# Patient Record
Sex: Female | Born: 2005 | Race: White | Hispanic: No | Marital: Single | Smoking: Never smoker
Health system: Southern US, Community
[De-identification: ages and names within clinical notes are randomized; demographics above are authoritative.]

## PROBLEM LIST (undated history)

## (undated) DIAGNOSIS — F419 Anxiety disorder, unspecified: Secondary | ICD-10-CM

## (undated) DIAGNOSIS — F32A Depression, unspecified: Secondary | ICD-10-CM

## (undated) DIAGNOSIS — G43909 Migraine, unspecified, not intractable, without status migrainosus: Secondary | ICD-10-CM

## (undated) HISTORY — PX: WISDOM TOOTH EXTRACTION: SHX21

## (undated) HISTORY — PX: TONSILLECTOMY: SUR1361

## (undated) HISTORY — DX: Anxiety disorder, unspecified: F41.9

## (undated) HISTORY — DX: Depression, unspecified: F32.A

---
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
## 2023-01-01 ENCOUNTER — Other Ambulatory Visit: Payer: No Typology Code available for payment source

## 2023-01-01 ENCOUNTER — Ambulatory Visit (INDEPENDENT_AMBULATORY_CARE_PROVIDER_SITE_OTHER): Payer: No Typology Code available for payment source | Admitting: Advanced Practice Midwife

## 2023-01-01 VITALS — BP 113/72 | HR 87 | Wt 185.0 lb

## 2023-01-01 DIAGNOSIS — Z3403 Encounter for supervision of normal first pregnancy, third trimester: Secondary | ICD-10-CM

## 2023-01-01 DIAGNOSIS — T300 Burn of unspecified body region, unspecified degree: Secondary | ICD-10-CM

## 2023-01-01 DIAGNOSIS — Z3402 Encounter for supervision of normal first pregnancy, second trimester: Secondary | ICD-10-CM

## 2023-01-01 DIAGNOSIS — Z3A28 28 weeks gestation of pregnancy: Secondary | ICD-10-CM

## 2023-01-01 MED ORDER — SERTRALINE HCL 50 MG PO TABS: 50.0000 mg | ORAL_TABLET | Freq: Every day | ORAL | 1 refills | Status: DC

## 2023-01-01 NOTE — Progress Notes (Addendum)
   PRENATAL VISIT NOTE  Subjective:  Tammy Stone is a 17 y.o. G1P0 at [redacted]w[redacted]d being seen today for ongoing prenatal care.  She is currently monitored for the following issues for this low-risk pregnancy and has Supervision of normal first teen pregnancy on their problem list.  Patient reports no complaints.  Contractions: Not present. Vag. Bleeding: None.  Movement: Present. Denies leaking of fluid.   The following portions of the patient's history were reviewed and updated as appropriate: allergies, current medications, past family history, past medical history, past social history, past surgical history and problem list. Problem list updated.  Objective:   Vitals:   01/01/23 0827  BP: 113/72  Pulse: 87  Weight: 185 lb (83.9 kg)    Fetal Status: Fetal Heart Rate (bpm): 145 Fundal Height: 28 cm Movement: Present     General:  Alert, oriented and cooperative. Patient is in no acute distress.  Skin: Skin is warm and dry. No rash noted.   Cardiovascular: Normal heart rate noted  Respiratory: Normal respiratory effort, no problems with respiration noted  Abdomen: Soft, gravid, appropriate for gestational age.  Pain/Pressure: Absent     Pelvic: Cervical exam deferred        Extremities: Normal range of motion.  Edema: None  Mental Status: Normal mood and affect. Normal behavior. Normal judgment and thought content.   Assessment and Plan:  Pregnancy: G1P0 at [redacted]w[redacted]d  1. Supervision of normal first teen pregnancy in third trimester - Welcome to third trimester! - TDAP declined - Fasting GTT today, preemptive discussion, impact of GDM on prenatal care and fetal surveillance  2. [redacted] weeks gestation of pregnancy  3. Burn - Patient will 3cm x 1 cm superficial burn on her neck, slightly right of midline. Healing well. Advised OTC vitamin e oil, can combined with eczema cream as needed. Use sunblock during day  Preterm labor symptoms and general obstetric precautions including but  not limited to vaginal bleeding, contractions, leaking of fluid and fetal movement were reviewed in detail with the patient. Please refer to After Visit Summary for other counseling recommendations.    Future Appointments  Date Time Provider Department Center  01/01/2023  8:55 AM Calvert Cantor, CNM [facility]  01/15/2023  3:30 PM Calvert Cantor, CNM [facility]  01/29/2023  3:30 PM Calvert Cantor, CNM [facility]  02/03/2023  1:20 PM Margarita Mail, DO [facility]    Calvert Cantor, [place]

## 2023-01-01 NOTE — Progress Notes (Signed)
CC: ROB, Denies any concerns

## 2023-01-02 ENCOUNTER — Other Ambulatory Visit: Payer: Self-pay | Admitting: *Deleted

## 2023-01-02 LAB — CBC
Hematocrit: 34.3 % (ref 34.0–46.6)
Hemoglobin: 10.9 g/dL — ABNORMAL LOW (ref 11.1–15.9)
MCH: 27.8 pg (ref 26.6–33.0)
MCHC: 31.8 g/dL (ref 31.5–35.7)
MCV: 88 fL (ref 79–97)
Platelets: 330 10*3/uL (ref 150–450)
RBC: 3.92 x10E6/uL (ref 3.77–5.28)
RDW: 13.1 % (ref 11.7–15.4)
WBC: 11.5 10*3/uL — ABNORMAL HIGH (ref 3.4–10.8)

## 2023-01-02 LAB — GLUCOSE TOLERANCE, 2 HOURS W/ 1HR
Glucose, 1 hour: 116 mg/dL (ref 70–179)
Glucose, 2 hour: 102 mg/dL (ref 70–152)
Glucose, Fasting: 75 mg/dL (ref 70–91)

## 2023-01-02 LAB — RPR: RPR Ser Ql: NONREACTIVE

## 2023-01-02 LAB — HIV ANTIBODY (ROUTINE TESTING W REFLEX): HIV Screen 4th Generation wRfx: NONREACTIVE

## 2023-01-02 MED ORDER — FERROUS GLUCONATE 324 (38 FE) MG PO TABS: 324.0000 mg | ORAL_TABLET | ORAL | 3 refills | Status: AC

## 2023-01-15 ENCOUNTER — Encounter: Payer: Self-pay | Admitting: Advanced Practice Midwife

## 2023-01-15 ENCOUNTER — Ambulatory Visit (INDEPENDENT_AMBULATORY_CARE_PROVIDER_SITE_OTHER): Payer: No Typology Code available for payment source | Admitting: Advanced Practice Midwife

## 2023-01-15 VITALS — BP 113/73 | HR 84 | Wt 186.0 lb

## 2023-01-15 DIAGNOSIS — Z3A30 30 weeks gestation of pregnancy: Secondary | ICD-10-CM

## 2023-01-15 DIAGNOSIS — Z3403 Encounter for supervision of normal first pregnancy, third trimester: Secondary | ICD-10-CM

## 2023-01-15 NOTE — Progress Notes (Signed)
ROB

## 2023-01-16 NOTE — Progress Notes (Signed)
   PRENATAL VISIT NOTE  Subjective:  Tammy Stone is a 17 y.o. G1P0 at [redacted]w[redacted]d being seen today for ongoing prenatal care.  She is currently monitored for the following issues for this low-risk pregnancy and has Supervision of normal first teen pregnancy on their problem list.  Patient reports no complaints.  Contractions: Not present. Vag. Bleeding: None.  Movement: Present. Denies leaking of fluid.   The following portions of the patient's history were reviewed and updated as appropriate: allergies, current medications, past family history, past medical history, past social history, past surgical history and problem list. Problem list updated.  Objective:   Vitals:   01/15/23 1532  BP: 113/73  Pulse: 84  Weight: 186 lb (84.4 kg)    Fetal Status: Fetal Heart Rate (bpm): 160 Fundal Height: 30 cm Movement: Present     General:  Alert, oriented and cooperative. Patient is in no acute distress.  Skin: Skin is warm and dry. No rash noted.   Cardiovascular: Normal heart rate noted  Respiratory: Normal respiratory effort, no problems with respiration noted  Abdomen: Soft, gravid, appropriate for gestational age.  Pain/Pressure: Absent     Pelvic: Cervical exam deferred        Extremities: Normal range of motion.  Edema: None  Mental Status: Normal mood and affect. Normal behavior. Normal judgment and thought content.   Assessment and Plan:  Pregnancy: G1P0 at [redacted]w[redacted]d  1. Supervision of normal first teen pregnancy in third trimester - Doing so well in third trimester! - Quick review of kick counts, interventions for low kick number  2. [redacted] weeks gestation of pregnancy   Preterm labor symptoms and general obstetric precautions including but not limited to vaginal bleeding, contractions, leaking of fluid and fetal movement were reviewed in detail with the patient. Please refer to After Visit Summary for other counseling recommendations.    Future Appointments  Date Time Provider  Department Center  01/29/2023  3:30 PM Calvert Cantor, [place] [facility]  02/03/2023  1:20 PM Margarita Mail, DO [facility]  02/12/2023  4:10 PM Calvert Cantor, CNM [facility]  02/26/2023  1:50 PM [place] Large, Jethro Bastos, MD [facility]  03/04/2023  3:30 PM [place] Bing, MD [facility]  03/12/2023  3:30 PM [place] Large, Jethro Bastos, MD [facility]  03/19/2023  3:30 PM [place] Bing, MD [facility]    Calvert Cantor, CNM

## 2023-01-23 ENCOUNTER — Ambulatory Visit: Payer: Self-pay | Admitting: Internal Medicine

## 2023-01-26 ENCOUNTER — Encounter (HOSPITAL_COMMUNITY): Payer: Self-pay | Admitting: Obstetrics & Gynecology

## 2023-01-26 ENCOUNTER — Inpatient Hospital Stay (HOSPITAL_COMMUNITY)
Admission: AD | Admit: 2023-01-26 | Discharge: 2023-01-26 | Disposition: A | Discharge status: Home / Self Care | Payer: No Typology Code available for payment source | Attending: Obstetrics & Gynecology | Admitting: Obstetrics & Gynecology

## 2023-01-26 DIAGNOSIS — O368130 Decreased fetal movements, third trimester, not applicable or unspecified: Secondary | ICD-10-CM | POA: Insufficient documentation

## 2023-01-26 DIAGNOSIS — Z3A31 31 weeks gestation of pregnancy: Secondary | ICD-10-CM | POA: Insufficient documentation

## 2023-01-26 DIAGNOSIS — O368190 Decreased fetal movements, unspecified trimester, not applicable or unspecified: Secondary | ICD-10-CM

## 2023-01-26 DIAGNOSIS — Z3689 Encounter for other specified antenatal screening: Secondary | ICD-10-CM

## 2023-01-26 NOTE — MAU Provider Note (Signed)
History     CSN: [id number]  Arrival date and time: 01/26/23 1332   Event Date/Time   First Provider Initiated Contact with Patient 01/26/23 1437      Chief Complaint  Patient presents with   Decreased Fetal Movement   HPI  Tammy Stone is a 17 y.o. female G1P0 @ [redacted]w[redacted]d here in MAU with complaints of decreased fetal movement. She reports not feeling movement since last night. She does have an anterior placenta. She has no other concerns. No bleeding no leaking of fluid.   OB History     Gravida  1   Para      Term      Preterm      AB      Living         SAB      IAB      Ectopic      Multiple      Live Births              Past Medical History:  Diagnosis Date   Migraine     Past Surgical History:  Procedure Laterality Date   TONSILLECTOMY     WISDOM TOOTH EXTRACTION      Family History  Problem Relation Age of Onset   Hypertension Mother    Hyperlipidemia Mother    Diabetes Mother    Thyroid cancer Father    Diabetes Maternal Grandmother    Hyperlipidemia Maternal Grandfather     Social History   Tobacco Use   Smoking status: Never   Smokeless tobacco: Never  Vaping Use   Vaping Use: Never used  Substance Use Topics   Alcohol use: No   Drug use: No    Allergies: No Known Allergies  Medications Prior to Admission  Medication Sig Dispense Refill Last Dose   ferrous gluconate (FERGON) 324 MG tablet Take 1 tablet (324 mg total) by mouth every other day. 30 tablet 3 01/25/2023   Prenatal Vit-Fe Fumarate-FA (MULTIVITAMIN-PRENATAL) 27-0.8 MG TABS tablet Take 1 tablet by mouth daily at 12 noon.   01/26/2023   sertraline (ZOLOFT) 50 MG tablet Take 50 mg by mouth daily.   01/25/2023   fexofenadine (ALLEGRA) 60 MG tablet Take 60 mg by mouth daily.      sertraline (ZOLOFT) 50 MG tablet Take 1 tablet (50 mg total) by mouth daily. 30 tablet 1    No results found for this or any previous visit (from the past 48 hour(s)).   Review  of Systems  Gastrointestinal:  Negative for abdominal pain.  Genitourinary:  Negative for vaginal bleeding.   Physical Exam   Blood pressure 126/68, pulse 86, temperature 98.1 F (36.7 C), temperature source Oral, resp. rate 14, height 5\' 5"  (1.651 m), last menstrual period 06/17/2022, SpO2 99 %.  Physical Exam Constitutional:      Appearance: Normal appearance.  HENT:     Head: Normocephalic.  Neurological:     Mental Status: She is alert and oriented to person, place, and time.  Psychiatric:        Behavior: Behavior normal.    140 baseline, 15x15 accels with prolonged accels at times. No decels. Marked variability at times.   MAU Course  Procedures  MDM  Patient feeling normal movement at this time.   Assessment and Plan   A:  1. [redacted] weeks gestation of pregnancy   2. Decreased fetal movement during pregnancy, antepartum, single or unspecified fetus   3. NST (non-stress test)  reactive     P:  Dc home Kick counts reviewed Return to MAU with further DFM  Auryn Paige, Harolyn Rutherford, NP 01/26/2023 5:06 PM

## 2023-01-26 NOTE — MAU Note (Signed)
.  Tammy Stone is a 17 y.o. at [redacted]w[redacted]d here in MAU reporting: has not felt the baby move since last night, states she has an anterior placenta. Denies VB or LOF. No pain.   Pain score: 0 Vitals:   01/26/23 1341  BP: 126/68  Pulse: 86  Resp: 14  Temp: 98.1 F (36.7 C)  SpO2: 97%     FHT:158 (audible fetal movement) Lab orders placed from triage:  n/a

## 2023-01-29 ENCOUNTER — Encounter: Payer: Self-pay | Admitting: Advanced Practice Midwife

## 2023-01-29 ENCOUNTER — Ambulatory Visit (INDEPENDENT_AMBULATORY_CARE_PROVIDER_SITE_OTHER): Payer: No Typology Code available for payment source | Admitting: Advanced Practice Midwife

## 2023-01-29 VITALS — BP 115/74 | HR 90 | Wt 193.0 lb

## 2023-01-29 DIAGNOSIS — Z3A32 32 weeks gestation of pregnancy: Secondary | ICD-10-CM

## 2023-01-29 DIAGNOSIS — Z3403 Encounter for supervision of normal first pregnancy, third trimester: Secondary | ICD-10-CM

## 2023-01-29 DIAGNOSIS — R21 Rash and other nonspecific skin eruption: Secondary | ICD-10-CM

## 2023-01-29 NOTE — Progress Notes (Signed)
ROB   Recent MAU visit on 01/26/23 for Decreased FM had NST done.  Pt has been moving fine since.   CC: Rash on right side of face.

## 2023-01-29 NOTE — Progress Notes (Signed)
ROB

## 2023-01-30 NOTE — Progress Notes (Signed)
   PRENATAL VISIT NOTE  Subjective:  Tammy Stone is a 17 y.o. G1P0 at [redacted]w[redacted]d being seen today for ongoing prenatal care.  She is currently monitored for the following issues for this low-risk pregnancy and has Supervision of normal first teen pregnancy on their problem list.  Patient reports  new flat rash on the right side of her face. Also has new raised rash disseminated along her torso. Rash is not itchy at either site. She has not taken medication for this complaint. Patient states [place] lot of time laying out.  .  Contractions: Not present. Vag. Bleeding: None.  Movement: Present. Denies leaking of fluid.   The following portions of the patient's history were reviewed and updated as appropriate: allergies, current medications, past family history, past medical history, past social history, past surgical history and problem list. Problem list updated.  Objective:   Vitals:   01/29/23 1538  BP: 115/74  Pulse: 90  Weight: 193 lb (87.5 kg)    Fetal Status: Fetal Heart Rate (bpm): 158 Fundal Height: 32 cm Movement: Present     General:  Alert, oriented and cooperative. Patient is in no acute distress.  Skin: Skin is warm and dry. No rash noted.   Cardiovascular: Normal heart rate noted  Respiratory: Normal respiratory effort, no problems with respiration noted  Abdomen: Soft, gravid, appropriate for gestational age.  Pain/Pressure: Absent     Pelvic: Cervical exam deferred        Extremities: Normal range of motion.  Edema: None  Mental Status: Normal mood and affect. Normal behavior. Normal judgment and thought content.   Assessment and Plan:  Pregnancy: G1P0 at [redacted]w[redacted]d  1. Supervision of normal first teen pregnancy in third trimester - Doing great! Birth class this week, can send questions via MyChart message as needed  2. Skin rash - Flat facial rash, no discharge, no signs of infection - Rash on torso seems consistent  with heat rash - Can trial Benadryl if rash becomes itchy as it heals - Discussed soothing bath packets (e.g. Aveeno oatmeal bath packets). Can use in warm bath or less diluted version as   3. [redacted] weeks gestation of pregnancy   Preterm labor symptoms and general obstetric precautions including but not limited to vaginal bleeding, contractions, leaking of fluid and fetal movement were reviewed in detail with the patient. Please refer to After Visit Summary for other counseling recommendations.    Future Appointments  Date Time Provider Department Center  02/03/2023  1:20 PM Margarita Mail, DO [facility]  02/12/2023  4:10 PM Calvert Cantor, CNM [facility]  02/26/2023  1:50 PM Anyanwu, Jethro Bastos, MD [facility]  03/04/2023  3:30 PM [place] Bing, MD [facility]  03/12/2023  3:30 PM Anyanwu, Jethro Bastos, MD [facility]  03/19/2023  3:30 PM [place] Bing, MD [facility]    Calvert Cantor, CNM

## 2023-02-03 ENCOUNTER — Ambulatory Visit: Payer: Medicaid Other | Admitting: Internal Medicine

## 2023-02-12 ENCOUNTER — Ambulatory Visit (INDEPENDENT_AMBULATORY_CARE_PROVIDER_SITE_OTHER): Payer: No Typology Code available for payment source | Admitting: Advanced Practice Midwife

## 2023-02-12 ENCOUNTER — Encounter: Payer: Self-pay | Admitting: Advanced Practice Midwife

## 2023-02-12 VITALS — BP 120/74 | HR 80 | Wt 191.0 lb

## 2023-02-12 DIAGNOSIS — Z3403 Encounter for supervision of normal first pregnancy, third trimester: Secondary | ICD-10-CM

## 2023-02-12 DIAGNOSIS — Z3A34 34 weeks gestation of pregnancy: Secondary | ICD-10-CM

## 2023-02-12 NOTE — Progress Notes (Signed)
ROB   Notes recent change in skin stretch marks on bottom area.

## 2023-02-14 NOTE — Progress Notes (Signed)
   PRENATAL VISIT NOTE  Subjective:  Tammy Stone is a 17 y.o. G1P0 at [redacted]w[redacted]d being seen today for ongoing prenatal care.  She is currently monitored for the following issues for this low-risk pregnancy and has Supervision of normal first teen pregnancy on their problem list.  Patient reports no complaints.  Contractions: Not present. Vag. Bleeding: None.  Movement: Present. Denies leaking of fluid.   Patient has questions about new onset stretch marks on her buttocks and low back.   The following portions of the patient's history were reviewed and updated as appropriate: allergies, current medications, past family history, past medical history, past social history, past surgical history and problem list. Problem list updated.  Objective:   Vitals:   02/12/23 1629  BP: 120/74  Pulse: 80  Weight: 191 lb (86.6 kg)    Fetal Status: Fetal Heart Rate (bpm): 132 Fundal Height: 34 cm Movement: Present     General:  Alert, oriented and cooperative. Patient is in no acute distress.  Skin: Skin is warm and dry. No rash noted.   Cardiovascular: Normal heart rate noted  Respiratory: Normal respiratory effort, no problems with respiration noted  Abdomen: Soft, gravid, appropriate for gestational age.  Pain/Pressure: Absent     Pelvic: Cervical exam deferred        Extremities: Normal range of motion.  Edema: None  Mental Status: Normal mood and affect. Normal behavior. Normal judgment and thought content.   Assessment and Plan:  Pregnancy: G1P0 at [redacted]w[redacted]d  1. Supervision of normal first teen pregnancy in third trimester - routine care - Super proud of [place] and how well she is doing in third trimester - Stretch marks examined, WNL, can continue applying lotion but will fade in postpartum period - Preemptive teaching: GBS and GC swabs at 36 weeks (next visit)  2. [redacted] weeks gestation of pregnancy   Preterm labor symptoms and general obstetric precautions including but not limited  to vaginal bleeding, contractions, leaking of fluid and fetal movement were reviewed in detail with the patient. Please refer to After Visit Summary for other counseling recommendations.    Future Appointments  Date Time Provider Department Center  02/26/2023  1:50 PM Anyanwu, Jethro Bastos, MD [facility]  03/04/2023  3:30 PM [place] Bing, MD [facility]  03/12/2023  3:30 PM Anyanwu, Jethro Bastos, MD [facility]  03/19/2023  3:30 PM [place] Bing, MD [facility]  04/07/2023 11:00 AM Margarita Mail, DO [facility]    Calvert Cantor, CNM

## 2023-02-26 ENCOUNTER — Encounter: Payer: Self-pay | Admitting: Obstetrics & Gynecology

## 2023-02-26 ENCOUNTER — Ambulatory Visit (INDEPENDENT_AMBULATORY_CARE_PROVIDER_SITE_OTHER): Payer: No Typology Code available for payment source | Admitting: Obstetrics & Gynecology

## 2023-02-26 ENCOUNTER — Other Ambulatory Visit (HOSPITAL_COMMUNITY)
Admission: RE | Admit: 2023-02-26 | Discharge: 2023-02-26 | Disposition: A | Discharge status: Home / Self Care | Payer: No Typology Code available for payment source | Source: Ambulatory Visit | Attending: Obstetrics & Gynecology | Admitting: Obstetrics & Gynecology

## 2023-02-26 VITALS — BP 120/77 | HR 78 | Wt 197.0 lb

## 2023-02-26 DIAGNOSIS — Z3A36 36 weeks gestation of pregnancy: Secondary | ICD-10-CM | POA: Insufficient documentation

## 2023-02-26 DIAGNOSIS — Z3403 Encounter for supervision of normal first pregnancy, third trimester: Secondary | ICD-10-CM | POA: Insufficient documentation

## 2023-02-26 NOTE — Patient Instructions (Signed)
Return to office for any scheduled appointments. Call the office or go to the MAU at [facility] if: You begin to have strong, frequent contractions Your water breaks.  Sometimes it is a big gush of fluid, sometimes it is just a trickle that keeps getting your underwear wet or running down your legs You have vaginal bleeding.  It is normal to have a small amount of spotting if your cervix was checked.  You do not feel your baby moving like normal.  If you do not, get something to eat and drink and lay down and focus on feeling your baby move.   If your baby is still not moving like normal, you should call the office or go to MAU. Any other obstetric concerns.  

## 2023-02-26 NOTE — Progress Notes (Signed)
   PRENATAL VISIT NOTE  Subjective:  Tammy Stone is a 17 y.o. G1P0 at [redacted]w[redacted]d being seen today for ongoing prenatal care.  She is currently monitored for the following issues for this low-risk pregnancy and has Supervision of normal first teen pregnancy on their problem list.  Patient reports  pelvic pressure .  Contractions: Not present. Vag. Bleeding: None.  Movement: Present. Denies leaking of fluid.   The following portions of the patient's history were reviewed and updated as appropriate: allergies, current medications, past family history, past medical history, past social history, past surgical history and problem list.   Objective:   Vitals:   02/26/23 1344  BP: 120/77  Pulse: 78  Weight: 197 lb (89.4 kg)    Fetal Status: Fetal Heart Rate (bpm): 145   Movement: Present  Presentation: Vertex  General:  Alert, oriented and cooperative. Patient is in no acute distress.  Skin: Skin is warm and dry. No rash noted.   Cardiovascular: Normal heart rate noted  Respiratory: Normal respiratory effort, no problems with respiration noted  Abdomen: Soft, gravid, appropriate for gestational age.  Pain/Pressure: Absent     Pelvic: Cervical exam performed in the presence of a chaperone Dilation: Closed Effacement (%): Thick Station: Ballotable  Extremities: Normal range of motion.  Edema: None  Mental Status: Normal mood and affect. Normal behavior. Normal judgment and thought content.   Assessment and Plan:  Pregnancy: G1P0 at [redacted]w[redacted]d 1. [redacted] weeks gestation of pregnancy 2. Supervision of normal first teen pregnancy in third trimester Cultures done, will follow up results and manage accordingly. - Cervicovaginal ancillary only - Strep Gp B NAA  Preterm labor symptoms and general obstetric precautions including but not limited to vaginal bleeding, contractions, leaking of fluid and fetal movement were reviewed in detail with the patient. Please refer to After Visit Summary for other  counseling recommendations.   Return in about 1 week (around 03/05/2023) for OFFICE OB VISIT (MD only).  Future Appointments  Date Time Provider Department Center  03/04/2023  3:30 PM [place] Bing, MD [facility]  03/12/2023  3:30 PM Lavilla Delamora, Jethro Bastos, MD [facility]  03/19/2023  3:30 PM [place] Bing, MD [facility]  04/07/2023 11:00 AM Margarita Mail, DO [facility]    Jaynie Collins, MD

## 2023-02-27 LAB — CERVICOVAGINAL ANCILLARY ONLY
Chlamydia: NEGATIVE
Comment: NEGATIVE
Comment: NORMAL
Neisseria Gonorrhea: NEGATIVE

## 2023-02-28 ENCOUNTER — Encounter: Payer: Self-pay | Admitting: Obstetrics & Gynecology

## 2023-02-28 DIAGNOSIS — O99820 Streptococcus B carrier state complicating pregnancy: Secondary | ICD-10-CM | POA: Insufficient documentation

## 2023-02-28 LAB — STREP GP B NAA: Strep Gp B NAA: POSITIVE — AB

## 2023-03-02 ENCOUNTER — Other Ambulatory Visit: Payer: Self-pay | Admitting: Family Medicine

## 2023-03-04 ENCOUNTER — Ambulatory Visit (INDEPENDENT_AMBULATORY_CARE_PROVIDER_SITE_OTHER): Payer: No Typology Code available for payment source | Admitting: Obstetrics and Gynecology

## 2023-03-04 VITALS — BP 127/81 | HR 81 | Wt 199.6 lb

## 2023-03-04 DIAGNOSIS — O99820 Streptococcus B carrier state complicating pregnancy: Secondary | ICD-10-CM

## 2023-03-04 DIAGNOSIS — Z3A37 37 weeks gestation of pregnancy: Secondary | ICD-10-CM

## 2023-03-04 NOTE — Progress Notes (Signed)
ROB: Cramping and wants to have cervix checked

## 2023-03-04 NOTE — Progress Notes (Signed)
   PRENATAL VISIT NOTE  Subjective:  Tammy Stone is a 17 y.o. G1P0 at [redacted]w[redacted]d being seen today for ongoing prenatal care.  She is currently monitored for the following issues for this low-risk pregnancy and has Supervision of normal first teen pregnancy and GBS (group B Streptococcus carrier), +RV culture, currently pregnant on their problem list.  Patient reports occasional contractions.  Contractions: Irregular. Vag. Bleeding: None.  Movement: Present. Denies leaking of fluid.   The following portions of the patient's history were reviewed and updated as appropriate: allergies, current medications, past family history, past medical history, past social history, past surgical history and problem list.   Objective:   Vitals:   03/04/23 1513  BP: 127/81  Pulse: 81  Weight: 199 lb 9.6 oz (90.5 kg)    Fetal Status: Fetal Heart Rate (bpm): 132 Fundal Height: 37 cm Movement: Present  Presentation: Vertex  General:  Alert, oriented and cooperative. Patient is in no acute distress.  Skin: Skin is warm and dry. No rash noted.   Cardiovascular: Normal heart rate noted  Respiratory: Normal respiratory effort, no problems with respiration noted  Abdomen: Soft, gravid, appropriate for gestational age.  Pain/Pressure: Present     Pelvic: Cervical exam performed in the presence of a chaperone Dilation: Fingertip Effacement (%): 50 Station: -3  Extremities: Normal range of motion.  Edema: Trace  Mental Status: Normal mood and affect. Normal behavior. Normal judgment and thought content.   Assessment and Plan:  Pregnancy: G1P0 at [redacted]w[redacted]d 1. [redacted] weeks gestation of pregnancy Routine care  2. GBS (group B Streptococcus carrier), +RV culture, currently pregnant D/w pt and family  Term labor symptoms and general obstetric precautions including but not limited to vaginal bleeding, contractions, leaking of fluid and fetal movement were reviewed in detail with the patient. Please refer to After  Visit Summary for other counseling recommendations.   No follow-ups on file.  Future Appointments  Date Time Provider Department Center  03/12/2023  3:30 PM Anyanwu, Jethro Bastos, MD [facility]  03/19/2023  3:30 PM Lihue Bing, MD [facility]  04/07/2023 11:00 AM Margarita Mail, DO [facility]    [place] Bing, MD

## 2023-03-10 ENCOUNTER — Telehealth: Payer: Self-pay | Admitting: *Deleted

## 2023-03-10 NOTE — Telephone Encounter (Signed)
Pts mother left voicemail, called back and spoke to pt. Pt has noticed more swelling in her feet, but states she is more swollen on the right side and they did check her BP which was 139/77. Pt denies any HA, baby has been moving well. Advised pt to increase water intake and try to elevate and rest as swelling in the end of pregnancy can be very normal and can be more one sided depending on how baby is laying. Also advised that is swelling gets more concerning or get a HA and checks her BP and it is over 140/90, to please call the office and we will bring her in for  a check. Pt verbalizes and understnds

## 2023-03-12 ENCOUNTER — Encounter: Payer: Self-pay | Admitting: Obstetrics & Gynecology

## 2023-03-12 ENCOUNTER — Ambulatory Visit (INDEPENDENT_AMBULATORY_CARE_PROVIDER_SITE_OTHER): Payer: No Typology Code available for payment source | Admitting: Obstetrics & Gynecology

## 2023-03-12 VITALS — BP 123/76 | HR 82 | Wt 201.0 lb

## 2023-03-12 DIAGNOSIS — Z3403 Encounter for supervision of normal first pregnancy, third trimester: Secondary | ICD-10-CM

## 2023-03-12 DIAGNOSIS — Z3A38 38 weeks gestation of pregnancy: Secondary | ICD-10-CM

## 2023-03-12 NOTE — Progress Notes (Signed)
   PRENATAL VISIT NOTE  Subjective:  Tammy Stone is a 17 y.o. G1P0 at [redacted]w[redacted]d being seen today for ongoing prenatal care.  She is currently monitored for the following issues for this low-risk pregnancy and has Supervision of normal first teen pregnancy and GBS (group B Streptococcus carrier), +RV culture, currently pregnant on their problem list.  Patient reports occasional contractions, desires exam.  Contractions: Irritability. Vag. Bleeding: None.  Movement: Present. Denies leaking of fluid.   The following portions of the patient's history were reviewed and updated as appropriate: allergies, current medications, past family history, past medical history, past social history, past surgical history and problem list.   Objective:   Vitals:   03/12/23 1528  BP: 123/76  Pulse: 82  Weight: 201 lb (91.2 kg)    Fetal Status: Fetal Heart Rate (bpm): 158 Fundal Height: 39 cm Movement: Present     General:  Alert, oriented and cooperative. Patient is in no acute distress.  Skin: Skin is warm and dry. No rash noted.   Cardiovascular: Normal heart rate noted  Respiratory: Normal respiratory effort, no problems with respiration noted  Abdomen: Soft, gravid, appropriate for gestational age.  Pain/Pressure: Present     Pelvic: Cervical exam performed in the presence of a chaperone Dilation: 1 Effacement (%): 50 Station: -3  Extremities: Normal range of motion.  Edema: Moderate pitting, indentation subsides rapidly  Mental Status: Normal mood and affect. Normal behavior. Normal judgment and thought content.   Assessment and Plan:  Pregnancy: G1P0 at [redacted]w[redacted]d 1. [redacted] weeks gestation of pregnancy 2. Supervision of normal first teen pregnancy in third trimester No concerns.  Term labor symptoms and general obstetric precautions including but not limited to vaginal bleeding, contractions, leaking of fluid and fetal movement were reviewed in detail with the patient. Please refer to After Visit  Summary for other counseling recommendations.   Return in about 1 week (around 03/19/2023) for OFFICE OB VISIT (MD or APP).  Future Appointments  Date Time Provider Department Center  03/19/2023  3:30 PM [place] Bing, MD [facility]  04/07/2023 11:00 AM Margarita Mail, DO [facility]    Jaynie Collins, MD

## 2023-03-12 NOTE — Patient Instructions (Signed)
Return to office for any scheduled appointments. Call the office or go to the MAU at [facility] [place] if: You begin to have strong, frequent contractions Your water breaks.  Sometimes it is a big gush of fluid, sometimes it is just a trickle that keeps getting your underwear wet or running down your legs You have vaginal bleeding.  It is normal to have a small amount of spotting if your cervix was checked.  You do not feel your baby moving like normal.  If you do not, get something to eat and drink and lay down and focus on feeling your baby move.   If your baby is still not moving like normal, you should call the office or go to MAU. Any other obstetric concerns.  

## 2023-03-15 ENCOUNTER — Encounter: Payer: Self-pay | Admitting: Obstetrics and Gynecology

## 2023-03-17 ENCOUNTER — Inpatient Hospital Stay (EMERGENCY_DEPARTMENT_HOSPITAL)
Admission: AD | Admit: 2023-03-17 | Discharge: 2023-03-17 | Disposition: A | Discharge status: Home / Self Care | Payer: No Typology Code available for payment source | Source: Home / Self Care | Attending: Obstetrics and Gynecology | Admitting: Obstetrics and Gynecology

## 2023-03-17 ENCOUNTER — Encounter (HOSPITAL_COMMUNITY): Payer: Self-pay | Admitting: Obstetrics and Gynecology

## 2023-03-17 DIAGNOSIS — Z3A39 39 weeks gestation of pregnancy: Secondary | ICD-10-CM | POA: Insufficient documentation

## 2023-03-17 DIAGNOSIS — Z3403 Encounter for supervision of normal first pregnancy, third trimester: Secondary | ICD-10-CM

## 2023-03-17 DIAGNOSIS — O471 False labor at or after 37 completed weeks of gestation: Secondary | ICD-10-CM | POA: Insufficient documentation

## 2023-03-17 DIAGNOSIS — O99820 Streptococcus B carrier state complicating pregnancy: Secondary | ICD-10-CM

## 2023-03-17 NOTE — Discharge Instructions (Signed)
2/3-1-1 Rule Go to MAU for painful contractions every 2-3 minutes, lasting 1 minute each for 1.5 hours.  

## 2023-03-17 NOTE — MAU Provider Note (Signed)
Ms. Deloma Spindle is a G1P0 at [redacted]w[redacted]d seen in MAU for labor. RN labor check, not seen by provider. SVE by RN Dilation: 1 Effacement (%): 50 Station: -3 Exam by:: K.Wilson,RN  Cervix unchanged after >1 hour  NST - FHR: 140 bpm / moderate variability / accels present / decels absent / TOCO: regular every 3-5 mins   Plan:  D/C home with labor precautions Given 2/3-1-1 Rule: Return to MAU for painful contractions every 2-3 minutes, lasting 1 minute each for 1.5 hours.  Keep scheduled appt on 03/19/2023 with [facility]  Raelyn Mora, CNM  03/17/2023 8:45 AM

## 2023-03-17 NOTE — MAU Note (Signed)
I have communicated with R.Dawson,CNM and reviewed vital signs:  Vitals:   03/17/23 0710  BP: 115/62  Pulse: 86  Resp: 18  Temp: 98.6 F (37 C)  SpO2: 98%    Vaginal exam:  Dilation: 1 Effacement (%): 50 Cervical Position: Posterior Station: -3 Presentation: Vertex Exam by:: K.Dariush Mcnellis,RN,   Also reviewed contraction pattern and that non-stress test is reactive.  It has been documented that patient is contracting every 3-5 minutes with no cervical change over 1 hours not indicating active labor.  Patient denies any other complaints.Pt reports ctx not as frequent or painful as they  were last night  Based on this report provider has given order for discharge.  A discharge order and diagnosis entered by a provider.   Labor discharge instructions reviewed with patient.

## 2023-03-17 NOTE — MAU Note (Signed)
.  Tammy Stone is a 17 y.o. at [redacted]w[redacted]d here in MAU reporting: ctx since 1am . Were closer together earlier but still having them now. Good fetal movement reported. Denies any vag bleeding or leaking at this time. 1cm on last exam LMP:  Onset of complaint: 1am Pain score: 4-5 Vitals:   03/17/23 0710  BP: 115/62  Pulse: 86  Resp: 18  Temp: 98.6 F (37 C)  SpO2: 98%     FHT:145 Lab orders placed from triage:  labor eval

## 2023-03-19 ENCOUNTER — Ambulatory Visit (INDEPENDENT_AMBULATORY_CARE_PROVIDER_SITE_OTHER): Payer: No Typology Code available for payment source | Admitting: Obstetrics and Gynecology

## 2023-03-19 VITALS — BP 129/85 | HR 76 | Wt 203.0 lb

## 2023-03-19 DIAGNOSIS — O99820 Streptococcus B carrier state complicating pregnancy: Secondary | ICD-10-CM

## 2023-03-19 DIAGNOSIS — Z3A39 39 weeks gestation of pregnancy: Secondary | ICD-10-CM

## 2023-03-19 NOTE — Progress Notes (Signed)
ROB   Recent MAU visit on 03/17/23.  CC: vaginal bleeding and discharge reports may have already lost mucous plug.  Cervix check today.  Pt was told may need growth U/S. Pt and family would like to confirm how many family members can be in Labor and delivery rm.

## 2023-03-19 NOTE — Patient Instructions (Signed)
Continue to check your blood pressures once a day Call the office for blood pressures that are consistently above 140 for the top number or 90 for the bottom number   Hypertension During Pregnancy Hypertension is also called high blood pressure. High blood pressure means that the force of your blood moving in your body is too strong. It can cause problems for you and your baby. Different types of high blood pressure can happen during pregnancy. The types are: High blood pressure before you got pregnant. This is called chronic hypertension.  This can continue during your pregnancy. Your doctor will want to keep checking your blood pressure. You may need medicine to keep your blood pressure under control while you are pregnant. You will need follow-up visits after you have your baby. High blood pressure that goes up during pregnancy when it was normal before. This is called gestational hypertension. It will usually get better after you have your baby, but your doctor will need to watch your blood pressure to make sure that it is getting better. Very high blood pressure during pregnancy. This is called preeclampsia. Very high blood pressure is an emergency that needs to be checked and treated right away. You may develop very high blood pressure after giving birth. This is called postpartum preeclampsia. This usually occurs within 48 hours after childbirth but may occur up to 6 weeks after giving birth. This is rare. How does this affect me? If you have high blood pressure during pregnancy, you have a higher chance of developing high blood pressure: As you get older. If you get pregnant again. In some cases, high blood pressure during pregnancy can cause: Stroke. Heart attack. Damage to the kidneys, lungs, or liver. Preeclampsia. Jerky movements you cannot control (convulsions or seizures). Problems with the placenta.  What can I do to lower my risk?  Keep a healthy weight. Eat a healthy  diet. Follow what your doctor tells you about treating any medical problems that you had before becoming pregnant. It is very important to go to all of your doctor visits. Your doctor will check your blood pressure and make sure that your pregnancy is progressing as it should. Treatment should start early if a problem is found.  Follow these instructions at home:  Take your blood pressure 1-2 times per day. Call the office if your blood pressure is 155 or higher for the top number or 105 or higher for the bottom number.    Eating and drinking  Drink enough fluid to keep your pee (urine) pale yellow. Avoid caffeine. Lifestyle Do not use any products that contain nicotine or tobacco, such as cigarettes, e-cigarettes, and chewing tobacco. If you need help quitting, ask your doctor. Do not use alcohol or drugs. Avoid stress. Rest and get plenty of sleep. Regular exercise can help. Ask your doctor what kinds of exercise are best for you. General instructions Take over-the-counter and prescription medicines only as told by your doctor. Keep all prenatal and follow-up visits as told by your doctor. This is important. Contact a doctor if: You have symptoms that your doctor told you to watch for, such as: Headaches. Nausea. Vomiting. Belly (abdominal) pain. Dizziness. Light-headedness. Get help right away if: You have: Very bad belly pain that does not get better with treatment. A very bad headache that does not get better. Vomiting that does not get better. Sudden, fast weight gain. Sudden swelling in your hands, ankles, or face. Blood in your pee. Blurry vision. Double vision.  Shortness of breath. Chest pain. Weakness on one side of your body. Trouble talking. Summary High blood pressure is also called hypertension. High blood pressure means that the force of your blood moving in your body is too strong. High blood pressure can cause problems for you and your baby. Keep all  follow-up visits as told by your doctor. This is important. This information is not intended to replace advice given to you by your health care provider. Make sure you discuss any questions you have with your health care provider. Document Released: 09/20/2010 Document Revised: 12/09/2018 Document Reviewed: 09/14/2018 Elsevier Patient Education  2020 [employer].

## 2023-03-19 NOTE — Progress Notes (Addendum)
   PRENATAL VISIT NOTE  Subjective:  Tammy Stone is a 17 y.o. G1P0 at [redacted]w[redacted]d being seen today for ongoing prenatal care.  She is currently monitored for the following issues for this low-risk pregnancy and has Supervision of normal first teen pregnancy and GBS (group B Streptococcus carrier), +RV culture, currently pregnant on their problem list.  Patient reports occasional contractions.  Contractions: Not present. Vag. Bleeding: Small.  Movement: Present. Denies leaking of fluid.   The following portions of the patient's history were reviewed and updated as appropriate: allergies, current medications, past family history, past medical history, past social history, past surgical history and problem list.   Objective:   Vitals:   03/19/23 1525  BP: 129/85  Pulse: 76  Weight: 203 lb (92.1 kg)    Fetal Status: Fetal Heart Rate (bpm): 150 Fundal Height: 38 cm Movement: Present  Presentation: Vertex  General:  Alert, oriented and cooperative. Patient is in no acute distress.  Skin: Skin is warm and dry. No rash noted.   Cardiovascular: Normal heart rate noted  Respiratory: Normal respiratory effort, no problems with respiration noted  Abdomen: Soft, gravid, appropriate for gestational age.  Pain/Pressure: Present     Pelvic: Cervical exam performed in the presence of a chaperone Dilation: 1 Effacement (%): 80 Station: Ballotable  Extremities: Normal range of motion.  Edema: Deep pitting, indentation remains for a short time  Mental Status: Normal mood and affect. Normal behavior. Normal judgment and thought content.   Assessment and Plan:  Pregnancy: G1P0 at [redacted]w[redacted]d 1. [redacted] weeks gestation of pregnancy Patient amenable to membrane stripping (done today). Post dates IOL set up D/w her re: nst/afi for after 40wks next visit  2. GBS (group B Streptococcus carrier), +RV culture, currently pregnant Treat in labor  Term labor symptoms and general obstetric precautions including but not  limited to vaginal bleeding, contractions, leaking of fluid and fetal movement were reviewed in detail with the patient. Please refer to After Visit Summary for other counseling recommendations.   Return in about 1 week (around 03/26/2023) for in person, low risk ob, in office nst/bpp, md or app.  Future Appointments  Date Time Provider Department Center  03/26/2023  1:30 PM Tereso Newcomer, MD [facility]  03/31/2023  7:15 AM [facility] SCHED ROOM [facility] None  04/07/2023 11:00 AM Margarita Mail, DO [facility]    [place] Bing, MD

## 2023-03-20 ENCOUNTER — Other Ambulatory Visit: Payer: Self-pay

## 2023-03-20 ENCOUNTER — Inpatient Hospital Stay (HOSPITAL_COMMUNITY): Payer: No Typology Code available for payment source | Admitting: Anesthesiology

## 2023-03-20 ENCOUNTER — Encounter (HOSPITAL_COMMUNITY): Payer: Self-pay | Admitting: Obstetrics & Gynecology

## 2023-03-20 ENCOUNTER — Inpatient Hospital Stay (HOSPITAL_COMMUNITY)
Admission: AD | Admit: 2023-03-20 | Discharge: 2023-03-22 | DRG: 807 | Disposition: A | Discharge status: Home / Self Care | Payer: No Typology Code available for payment source | Attending: Obstetrics and Gynecology | Admitting: Obstetrics and Gynecology

## 2023-03-20 DIAGNOSIS — O4292 Full-term premature rupture of membranes, unspecified as to length of time between rupture and onset of labor: Principal | ICD-10-CM | POA: Diagnosis present

## 2023-03-20 DIAGNOSIS — O9902 Anemia complicating childbirth: Secondary | ICD-10-CM | POA: Diagnosis present

## 2023-03-20 DIAGNOSIS — O99893 Other specified diseases and conditions complicating puerperium: Secondary | ICD-10-CM | POA: Diagnosis present

## 2023-03-20 DIAGNOSIS — O4202 Full-term premature rupture of membranes, onset of labor within 24 hours of rupture: Secondary | ICD-10-CM | POA: Diagnosis not present

## 2023-03-20 DIAGNOSIS — R339 Retention of urine, unspecified: Secondary | ICD-10-CM | POA: Diagnosis present

## 2023-03-20 DIAGNOSIS — Z3A39 39 weeks gestation of pregnancy: Secondary | ICD-10-CM | POA: Diagnosis not present

## 2023-03-20 DIAGNOSIS — O135 Gestational [pregnancy-induced] hypertension without significant proteinuria, complicating the puerperium: Secondary | ICD-10-CM | POA: Diagnosis not present

## 2023-03-20 DIAGNOSIS — O139 Gestational [pregnancy-induced] hypertension without significant proteinuria, unspecified trimester: Secondary | ICD-10-CM | POA: Insufficient documentation

## 2023-03-20 DIAGNOSIS — O99824 Streptococcus B carrier state complicating childbirth: Secondary | ICD-10-CM | POA: Diagnosis present

## 2023-03-20 DIAGNOSIS — O09613 Supervision of young primigravida, third trimester: Secondary | ICD-10-CM | POA: Diagnosis not present

## 2023-03-20 DIAGNOSIS — O99820 Streptococcus B carrier state complicating pregnancy: Secondary | ICD-10-CM | POA: Diagnosis not present

## 2023-03-20 DIAGNOSIS — Z3400 Encounter for supervision of normal first pregnancy, unspecified trimester: Secondary | ICD-10-CM

## 2023-03-20 LAB — COMPREHENSIVE METABOLIC PANEL
ALT: 10 U/L (ref 0–44)
AST: 20 U/L (ref 15–41)
Albumin: 2.5 g/dL — ABNORMAL LOW (ref 3.5–5.0)
Alkaline Phosphatase: 175 U/L — ABNORMAL HIGH (ref 38–126)
Anion gap: 12 (ref 5–15)
BUN: 7 mg/dL (ref 6–20)
CO2: 18 mmol/L — ABNORMAL LOW (ref 22–32)
Calcium: 8.6 mg/dL — ABNORMAL LOW (ref 8.9–10.3)
Chloride: 102 mmol/L (ref 98–111)
Creatinine, Ser: 0.63 mg/dL (ref 0.44–1.00)
GFR, Estimated: >60 mL/min (ref >60)
Glucose, Bld: 80 mg/dL (ref 70–99)
Potassium: 3.6 mmol/L (ref 3.5–5.1)
Sodium: 132 mmol/L — ABNORMAL LOW (ref 135–145)
Total Bilirubin: 0.5 mg/dL (ref 0.3–1.2)
Total Protein: 6.3 g/dL — ABNORMAL LOW (ref 6.5–8.1)

## 2023-03-20 LAB — TYPE AND SCREEN
ABO/RH(D): O POS
Antibody Screen: NEGATIVE

## 2023-03-20 LAB — CBC
HCT: 36.7 % (ref 36.0–46.0)
Hemoglobin: 12 g/dL (ref 12.0–15.0)
MCH: 27.6 pg (ref 26.0–34.0)
MCHC: 32.7 g/dL (ref 30.0–36.0)
MCV: 84.6 fL (ref 80.0–100.0)
Platelets: 314 10*3/uL (ref 150–400)
RBC: 4.34 MIL/uL (ref 3.87–5.11)
RDW: 14.9 % (ref 11.5–15.5)
WBC: 12.9 10*3/uL — ABNORMAL HIGH (ref 4.0–10.5)
nRBC: 0 % (ref 0.0–0.2)

## 2023-03-20 MED ORDER — LIDOCAINE HCL (PF) 1 % IJ SOLN: 30.0000 mL | INTRAMUSCULAR | Status: DC | PRN

## 2023-03-20 MED ORDER — METHYLERGONOVINE MALEATE 0.2 MG PO TABS: 0.2000 mg | ORAL_TABLET | ORAL | Status: DC | PRN

## 2023-03-20 MED ORDER — SODIUM CHLORIDE 0.9% FLUSH: 3.0000 mL | INTRAVENOUS | Status: DC | PRN

## 2023-03-20 MED ORDER — DIPHENHYDRAMINE HCL 50 MG/ML IJ SOLN: 12.5000 mg | INTRAMUSCULAR | Status: DC | PRN

## 2023-03-20 MED ORDER — ACETAMINOPHEN 325 MG PO TABS: 650.0000 mg | ORAL_TABLET | ORAL | Status: DC | PRN

## 2023-03-20 MED ORDER — TERBUTALINE SULFATE 1 MG/ML IJ SOLN: 0.2500 mg | Freq: Once | INTRAMUSCULAR | Status: DC | PRN

## 2023-03-20 MED ORDER — LACTATED RINGERS IV SOLN: INTRAVENOUS | Status: DC

## 2023-03-20 MED ORDER — SOD CITRATE-CITRIC ACID 500-334 MG/5ML PO SOLN: 30.0000 mL | ORAL | Status: DC | PRN

## 2023-03-20 MED ORDER — EPHEDRINE 5 MG/ML INJ: 10.0000 mg | INTRAVENOUS | Status: DC | PRN

## 2023-03-20 MED ORDER — SODIUM CHLORIDE 0.9 % IV SOLN
5.0000 10*6.[IU] | Freq: Once | INTRAVENOUS | Status: AC
  Administered 2023-03-20: 5 10*6.[IU] via INTRAVENOUS

## 2023-03-20 MED ORDER — SODIUM CHLORIDE 0.9 % IV SOLN: 250.0000 mL | INTRAVENOUS | Status: DC | PRN

## 2023-03-20 MED ORDER — LACTATED RINGERS IV SOLN: 500.0000 mL | Freq: Once | INTRAVENOUS | Status: DC

## 2023-03-20 MED ORDER — METHYLERGONOVINE MALEATE 0.2 MG/ML IJ SOLN
INTRAMUSCULAR | Status: AC
  Administered 2023-03-20: 0.2 mg
  Filled 2023-03-20: qty 1

## 2023-03-20 MED ORDER — ONDANSETRON HCL 4 MG/2ML IJ SOLN: 4.0000 mg | Freq: Four times a day (QID) | INTRAMUSCULAR | Status: DC | PRN

## 2023-03-20 MED ORDER — COCONUT OIL OIL
1.0000 | TOPICAL_OIL | Status: DC | PRN
  Administered 2023-03-20: 1 via TOPICAL

## 2023-03-20 MED ORDER — LACTATED RINGERS IV SOLN
500.0000 mL | Freq: Once | INTRAVENOUS | Status: AC
  Administered 2023-03-20: 500 mL via INTRAVENOUS

## 2023-03-20 MED ORDER — METHYLERGONOVINE MALEATE 0.2 MG/ML IJ SOLN: 0.2000 mg | INTRAMUSCULAR | Status: DC | PRN

## 2023-03-20 MED ORDER — SODIUM CHLORIDE 0.9% FLUSH
3.0000 mL | Freq: Two times a day (BID) | INTRAVENOUS | Status: DC
  Administered 2023-03-21: 3 mL via INTRAVENOUS

## 2023-03-20 MED ORDER — SODIUM BICARBONATE 8.4 % IV SOLN
INTRAVENOUS | Status: DC | PRN
  Administered 2023-03-20: 5 mL via EPIDURAL

## 2023-03-20 MED ORDER — DIBUCAINE (PERIANAL) 1 % EX OINT: 1.0000 | TOPICAL_OINTMENT | CUTANEOUS | Status: DC | PRN

## 2023-03-20 MED ORDER — DIPHENHYDRAMINE HCL 25 MG PO CAPS: 25.0000 mg | ORAL_CAPSULE | Freq: Four times a day (QID) | ORAL | Status: DC | PRN

## 2023-03-20 MED ORDER — FENTANYL-BUPIVACAINE-NACL 0.5-0.125-0.9 MG/250ML-% EP SOLN
12.0000 mL/h | EPIDURAL | Status: DC | PRN
  Administered 2023-03-20: 12 mL/h via EPIDURAL

## 2023-03-20 MED ORDER — OXYTOCIN-SODIUM CHLORIDE 30-0.9 UT/500ML-% IV SOLN
1.0000 m[IU]/min | INTRAVENOUS | Status: DC
  Administered 2023-03-20: 2 m[IU]/min via INTRAVENOUS

## 2023-03-20 MED ORDER — FENTANYL CITRATE (PF) 100 MCG/2ML IJ SOLN: 100.0000 ug | INTRAMUSCULAR | Status: DC | PRN

## 2023-03-20 MED ORDER — BENZOCAINE-MENTHOL 20-0.5 % EX AERO
1.0000 | INHALATION_SPRAY | CUTANEOUS | Status: DC | PRN
  Administered 2023-03-21: 1 via TOPICAL
  Filled 2023-03-20: qty 56

## 2023-03-20 MED ORDER — OXYTOCIN-SODIUM CHLORIDE 30-0.9 UT/500ML-% IV SOLN: 2.5000 [IU]/h | INTRAVENOUS | Status: DC

## 2023-03-20 MED ORDER — OXYCODONE-ACETAMINOPHEN 5-325 MG PO TABS: 2.0000 | ORAL_TABLET | ORAL | Status: DC | PRN

## 2023-03-20 MED ORDER — OXYCODONE-ACETAMINOPHEN 5-325 MG PO TABS: 1.0000 | ORAL_TABLET | ORAL | Status: DC | PRN

## 2023-03-20 MED ORDER — TRANEXAMIC ACID-NACL 1000-0.7 MG/100ML-% IV SOLN
INTRAVENOUS | Status: AC
  Filled 2023-03-20: qty 100

## 2023-03-20 MED ORDER — SIMETHICONE 80 MG PO CHEW: 80.0000 mg | CHEWABLE_TABLET | ORAL | Status: DC | PRN

## 2023-03-20 MED ORDER — ONDANSETRON HCL 4 MG PO TABS: 4.0000 mg | ORAL_TABLET | ORAL | Status: DC | PRN

## 2023-03-20 MED ORDER — PENICILLIN G POT IN DEXTROSE 60000 UNIT/ML IV SOLN
3.0000 10*6.[IU] | INTRAVENOUS | Status: DC
  Administered 2023-03-20 (×3): 3 10*6.[IU] via INTRAVENOUS

## 2023-03-20 MED ORDER — ZOLPIDEM TARTRATE 5 MG PO TABS: 5.0000 mg | ORAL_TABLET | Freq: Every evening | ORAL | Status: DC | PRN

## 2023-03-20 MED ORDER — OXYTOCIN BOLUS FROM INFUSION
333.0000 mL | Freq: Once | INTRAVENOUS | Status: AC
  Administered 2023-03-20: 600 mL/h via INTRAVENOUS

## 2023-03-20 MED ORDER — PHENYLEPHRINE 80 MCG/ML (10ML) SYRINGE FOR IV PUSH (FOR BLOOD PRESSURE SUPPORT): 80.0000 ug | PREFILLED_SYRINGE | INTRAVENOUS | Status: DC | PRN

## 2023-03-20 MED ORDER — ACETAMINOPHEN 325 MG PO TABS
650.0000 mg | ORAL_TABLET | ORAL | Status: DC | PRN
  Administered 2023-03-21: 650 mg via ORAL
  Filled 2023-03-20: qty 2

## 2023-03-20 MED ORDER — LACTATED RINGERS IV SOLN: 500.0000 mL | INTRAVENOUS | Status: DC | PRN

## 2023-03-20 MED ORDER — WITCH HAZEL-GLYCERIN EX PADS: 1.0000 | MEDICATED_PAD | CUTANEOUS | Status: DC | PRN

## 2023-03-20 MED ORDER — FLEET ENEMA 7-19 GM/118ML RE ENEM: 1.0000 | ENEMA | RECTAL | Status: DC | PRN

## 2023-03-20 MED ORDER — ONDANSETRON HCL 4 MG/2ML IJ SOLN: 4.0000 mg | INTRAMUSCULAR | Status: DC | PRN

## 2023-03-20 MED ORDER — SENNOSIDES-DOCUSATE SODIUM 8.6-50 MG PO TABS
2.0000 | ORAL_TABLET | ORAL | Status: DC
  Administered 2023-03-21 – 2023-03-22 (×2): 2 via ORAL
  Filled 2023-03-20 (×2): qty 2

## 2023-03-20 MED ORDER — SERTRALINE HCL 50 MG PO TABS
50.0000 mg | ORAL_TABLET | Freq: Every day | ORAL | Status: DC
  Administered 2023-03-20 – 2023-03-21 (×2): 50 mg via ORAL
  Filled 2023-03-20 (×2): qty 1

## 2023-03-20 MED ORDER — FERROUS GLUCONATE 324 (38 FE) MG PO TABS
324.0000 mg | ORAL_TABLET | ORAL | Status: DC
  Administered 2023-03-21: 324 mg via ORAL
  Filled 2023-03-20: qty 1

## 2023-03-20 MED ORDER — IBUPROFEN 600 MG PO TABS
600.0000 mg | ORAL_TABLET | Freq: Four times a day (QID) | ORAL | Status: DC
  Administered 2023-03-21 – 2023-03-22 (×6): 600 mg via ORAL
  Filled 2023-03-20 (×6): qty 1

## 2023-03-20 MED ORDER — PRENATAL MULTIVITAMIN CH
1.0000 | ORAL_TABLET | Freq: Every day | ORAL | Status: DC
  Administered 2023-03-21: 1 via ORAL
  Filled 2023-03-20: qty 1

## 2023-03-20 MED ORDER — TETANUS-DIPHTH-ACELL PERTUSSIS 5-2.5-18.5 LF-MCG/0.5 IM SUSY: 0.5000 mL | PREFILLED_SYRINGE | Freq: Once | INTRAMUSCULAR | Status: DC

## 2023-03-20 MED ORDER — SERTRALINE HCL 50 MG PO TABS: 50.0000 mg | ORAL_TABLET | Freq: Every day | ORAL | Status: DC

## 2023-03-20 MED ORDER — TRANEXAMIC ACID-NACL 1000-0.7 MG/100ML-% IV SOLN
1000.0000 mg | INTRAVENOUS | Status: AC
  Administered 2023-03-20: 1000 mg via INTRAVENOUS

## 2023-03-20 NOTE — Lactation Note (Signed)
This note was copied from a baby's chart. Lactation Consultation Note  Patient Name: Tammy Stone [id number] Date: 03/20/2023 Age:19 hours Reason for consult: Initial assessment;1st time breastfeeding;Primapara;Term.  P1, term female infant, Birth Parent recently BF infant for 18 minutes with assistance from RN see flowsheet, infant was still cuing but Birth Parent was tired, LC demonstrated hand expression using breast model and Birth Parent easily expressed 3 mls of colostrum that was spoon fed to infant. Birth Parent will continue to BF infant according to hunger cues, on demand, 8 to 12+ times within 24 hours, skin to skin. Birth Parent knows to call RN/LC for further latch assistance if needed. LC discussed infant's input and output and infant had one stool since birth. LC discussed the importance of maternal rest, diet and hydration. Birth Parent was  made aware of O/P services, breastfeeding support groups, community resources, and our phone # for post-discharge questions.    Maternal Data Has patient been taught Hand Expression?: Yes Does the patient have breastfeeding experience prior to this delivery?: No  Feeding Mother's Current Feeding Choice: Breast Milk  LATCH Score done by (RN prior to [facility] entering the room) Latch: Repeated attempts needed to sustain latch, nipple held in mouth throughout feeding, stimulation needed to elicit sucking reflex.  Audible Swallowing: A few with stimulation  Type of Nipple: Everted at rest and after stimulation  Comfort (Breast/Nipple): Soft / non-tender  Hold (Positioning): Assistance needed to correctly position infant at breast and maintain latch.  LATCH Score: 7   Lactation Tools Discussed/Used    Interventions Interventions: Position options;Skin to skin;Education;Hand express;Breast massage;LC Services brochure;Breast feeding basics reviewed  Discharge Pump: DEBP;Personal  Consult Status Consult Status: Follow-up Date:  03/21/23 Follow-up type: In-patient    Tammy Stone 03/20/2023, 11:04 PM

## 2023-03-20 NOTE — Anesthesia Preprocedure Evaluation (Signed)
Anesthesia Evaluation  Patient identified by MRN, date of birth, ID band Patient awake    Reviewed: Allergy & Precautions, Patient's Chart, lab work & pertinent test results  Airway Mallampati: I       Dental no notable dental hx.    Pulmonary    Pulmonary exam normal        Cardiovascular Normal cardiovascular exam     Neuro/Psych  Headaches    GI/Hepatic   Endo/Other    Renal/GU      Musculoskeletal   Abdominal   Peds  Hematology   Anesthesia Other Findings   Reproductive/Obstetrics (+) Pregnancy                             Anesthesia Physical Anesthesia Plan  ASA: 2  Anesthesia Plan: Epidural   Post-op Pain Management:    Induction:   PONV Risk Score and Plan: 0  Airway Management Planned: Natural Airway  Additional Equipment:   Intra-op Plan:   Post-operative Plan:   Informed Consent: I have reviewed the patients History and Physical, chart, labs and discussed the procedure including the risks, benefits and alternatives for the proposed anesthesia with the patient or authorized representative who has indicated his/her understanding and acceptance.       Plan Discussed with:   Anesthesia Plan Comments: (Lab Results      Component                Value               Date                      WBC                      12.9 (H)            03/20/2023                HGB                      12.0                03/20/2023                HCT                      36.7                03/20/2023                MCV                      84.6                03/20/2023                PLT                      314                 03/20/2023           )       Anesthesia Quick Evaluation

## 2023-03-20 NOTE — Discharge Summary (Signed)
Postpartum Discharge Summary  Date of Service updated***     Patient Name: Tammy Stone DOB: 03/31/2006 MRN: [id number]  Date of admission: 03/20/2023 Delivery date:03/20/2023 Delivering provider: Alfredia Ferguson Date of discharge: 03/20/2023  Admitting diagnosis: Normal labor [O80, Z37.9] Intrauterine pregnancy: [redacted]w[redacted]d     Secondary diagnosis:  Principal Problem:   Vaginal delivery Active Problems:   Normal labor  Additional problems: ***    Discharge diagnosis: {DX.:23714}                                              Post partum procedures:{Postpartum procedures:23558} Augmentation: Pitocin Complications: None  Hospital course: Onset of Labor With Vaginal Delivery      17 y.o. yo G1P0 at [redacted]w[redacted]d was admitted in Latent Labor following PROM on 03/20/2023. Labor course was complicated by: none  Membrane Rupture Time/Date: 12:30 AM,03/20/2023  Delivery Method:Vaginal, Spontaneous Episiotomy: None Lacerations:  2nd degree;Perineal Patient had a postpartum course complicated by ***.  She is ambulating, tolerating a regular diet, passing flatus, and urinating well. Patient is discharged home in stable condition on 03/20/23.  Newborn Data: Birth date:03/20/2023 Birth time:7:22 PM Gender:Female Living status:Living Apgars: ,  Weight:   Magnesium Sulfate received: {Mag received:30440022} BMZ received: No Rhophylac:N/A MMR:N/A T-DaP:{Tdap:23962} Flu: {WUJ:81191} Transfusion:{Transfusion received:30440034}  Physical exam  Vitals:   03/20/23 1808 03/20/23 1830 03/20/23 1906 03/20/23 1922  BP:  124/71 120/71   Pulse:  82 87   Resp: 18 18 16 18   Temp:      TempSrc:      SpO2:      Weight:      Height:       General: {Exam; general:21111117} Lochia: {Desc; appropriate/inappropriate:30686::"appropriate"} Uterine Fundus: {Desc; firm/soft:30687} Incision: {Exam; incision:21111123} DVT Evaluation: {Exam; dvt:2111122} Labs: Lab Results  Component Value Date    WBC 12.9 (H) 03/20/2023   HGB 12.0 03/20/2023   HCT 36.7 03/20/2023   MCV 84.6 03/20/2023   PLT 314 03/20/2023      Latest Ref Rng & Units 03/20/2023    2:27 AM  CMP  Glucose 70 - 99 mg/dL 80   BUN 6 - 20 mg/dL 7   Creatinine 4.78 - 2.95 mg/dL 6.21   Sodium 308 - 657 mmol/L 132   Potassium 3.5 - 5.1 mmol/L 3.6   Chloride 98 - 111 mmol/L 102   CO2 22 - 32 mmol/L 18   Calcium 8.9 - 10.3 mg/dL 8.6   Total Protein 6.5 - 8.1 g/dL 6.3   Total Bilirubin 0.3 - 1.2 mg/dL 0.5   Alkaline Phos 38 - 126 U/L 175   AST 15 - 41 U/L 20   ALT 0 - 44 U/L 10    Edinburgh Score:     No data to display           After visit meds:  Allergies as of 03/20/2023   No Known Allergies   Med Rec must be completed prior to using this [facility]***        Discharge home in stable condition Infant Feeding: {Baby feeding:23562} Infant Disposition:{CHL IP OB HOME WITH QIONGE:95284} Discharge instruction: per After Visit Summary and Postpartum booklet. Activity: Advance as tolerated. Pelvic rest for 6 weeks.  Diet: {OB XLKG:40102725} Future Appointments: Future Appointments  Date Time Provider Department Center  03/26/2023  1:30 PM Anyanwu, Jethro Bastos, MD [facility]  04/07/2023 11:00 AM Margarita Mail, DO [facility]   Follow up Visit:  The following message was sent to [facility].  Please schedule this patient for a In person postpartum visit in 6 weeks with the following provider: Any provider. Additional Postpartum F/U: none   Low risk pregnancy complicated by:  none Delivery mode:  Vaginal, Spontaneous Anticipated Birth Control:  Unsure   03/20/2023 Chiagoziem Lizabeth Leyden, MD

## 2023-03-20 NOTE — Progress Notes (Signed)
Labor Progress Note  Tammy Stone is a 17 y.o. G1P0 at [redacted]w[redacted]d presented for PROM.  S: Patient is doing well and comfortable with epidural in place.   O:  BP 112/89   Pulse 89   Temp 98.4 F (36.9 C) (Oral)   Resp 16   Ht 5\' 5"  (1.651 m)   Wt 92.5 kg   LMP 06/17/2022   SpO2 98%   BMI 33.95 kg/m  EFM:125 bpm/Moderate variability/ 15x15 accels/ None decels CAT: 1 Toco: contracting every 2-4 minutes   CVE: Dilation: 3.5 Effacement (%): 80 Cervical Position: Posterior Station: -2 Presentation: Vertex Exam by:: Raliegh Ip RN   A&P: 17 y.o. G1P0 [redacted]w[redacted]d  here for PROM  #Labor: Progressing well. Patient is currently doing well on pitocin, will continue.  #Pain: Epidural #FWB: CAT 1 #GBS positive, PCN x2 doses    Tammy Arvin, MD [facility] PGY-3 03/20/23  10:53 AM

## 2023-03-20 NOTE — H&P (Signed)
Tammy Stone is a 17 y.o. female G1P0 with IUP at [redacted]w[redacted]d by [place] presenting for ROM at midnight.  Contractions started soon after.  She reports positive fetal movement. She denies leakage of fluid or vaginal bleeding.  Prenatal History/Complications: PNC at [facility] Pregnancy complications:  - Past Medical History: Past Medical History:  Diagnosis Date   Migraine     Past Surgical History: Past Surgical History:  Procedure Laterality Date   TONSILLECTOMY     WISDOM TOOTH EXTRACTION      Obstetrical History: OB History     Gravida  1   Para      Term      Preterm      AB      Living         SAB      IAB      Ectopic      Multiple      Live Births               Social History: Social History   Socioeconomic History   Marital status: Single    Spouse name: Not on file   Number of children: Not on file   Years of education: Not on file   Highest education level: Not on file  Occupational History    Comment: student  Tobacco Use   Smoking status: Never   Smokeless tobacco: Never  Vaping Use   Vaping status: Never Used  Substance and Sexual Activity   Alcohol use: No   Drug use: No   Sexual activity: Not Currently    Partners: Male  Other Topics Concern   Not on file  Social History Narrative   Not on file   Social Determinants of Health   Financial Resource Strain: Not on file  Food Insecurity: Not on file  Transportation Needs: Not on file  Physical Activity: Not on file  Stress: Not on file  Social Connections: Not on file    Family History: Family History  Problem Relation Age of Onset   Hypertension Mother    Hyperlipidemia Mother    Diabetes Mother    Thyroid cancer Father    Diabetes Maternal Grandmother    Hyperlipidemia Maternal Grandfather     Allergies: No Known Allergies  Medications Prior to Admission  Medication Sig Dispense Refill Last Dose   ferrous gluconate (FERGON) 324 MG tablet Take 1 tablet (324 mg  total) by mouth every other day. 30 tablet 3 03/19/2023   Prenatal Vit-Fe Fumarate-FA (MULTIVITAMIN-PRENATAL) 27-0.8 MG TABS tablet Take 1 tablet by mouth daily at 12 noon.   03/20/2023   sertraline (ZOLOFT) 50 MG tablet TAKE 1 TABLET BY MOUTH EVERY DAY 30 tablet 1 03/20/2023   fexofenadine (ALLEGRA) 60 MG tablet Take 60 mg by mouth daily.       Review of Systems   Constitutional: Negative for fever and chills Eyes: Negative for visual disturbances Respiratory: Negative for shortness of breath, dyspnea Cardiovascular: Negative for chest pain or palpitations  Gastrointestinal: Negative for vomiting, diarrhea and constipation.  POSITIVE for abdominal pain (contractions) Genitourinary: Negative for dysuria and urgency Musculoskeletal: Negative for back pain, joint pain, myalgias  Neurological: Negative for dizziness and headaches  Blood pressure 124/79, pulse (!) 103, temperature 98.1 F (36.7 C), temperature source Oral, resp. rate 20, height 5\' 5"  (1.651 m), weight 92.5 kg, last menstrual period 06/17/2022, SpO2 98%. General appearance: alert, cooperative, and no distress Lungs: normal respiratory effort Heart: regular rate and rhythm Abdomen:  soft, non-tender; bowel sounds normal Extremities: Homans sign is negative, no sign of DVT DTR's 2+ Presentation: cephalic Fetal monitoring  Baseline: 125 bpm, Variability: Good {> 6 bpm), Accelerations: Reactive, and Decelerations: Absent Uterine activity  q 3-5 minutes Dilation: 1.5 Effacement (%): 100 Station: -2 Exam by:: Santiago Bur, RN   Prenatal labs: ABO, Rh: --/--/O POS (07/19 1610) Antibody: NEG (07/19 0227) Rubella: 4.83 (01/10 1106) RPR: Non Reactive (05/02 0955)  HBsAg: Negative (01/10 1106)  HIV: Non Reactive (05/02 0955)  GBS: Positive/-- (06/27 1300)   Nursing Staff Provider  Office Location [place] Dating  03/24/2023, by Last Menstrual Period  [facility] Model [ x] Traditional [ ]  Centering [ ]  Mom-Baby Dyad Anatomy US   WNL, incomplete, f/u in 4 weeks  Language  English    Flu Vaccine  Received  Genetic/Carrier Screen  NIPS:  Low risk female  AFP:    Horizon: Neg x 4  TDaP Vaccine    Hgb A1C or  GTT Early  Third trimester   COVID Vaccine No    LAB RESULTS   Rhogam  O/Positive/-- (01/10 1106)  Blood Type O/Positive/-- (01/10 1106)   Baby Feeding Plan Breast  Antibody Negative (01/10 1106)  Contraception  Rubella 4.83 (01/10 1106)  Circumcision Yes If Boy  RPR Non Reactive (05/02 0955)   Pediatrician  Undecided HBsAg Negative (01/10 1106)   Support Person FOB/Family HCVAb Non Reactive (01/10 1106)   Prenatal Classes  HIV Non Reactive (05/02 0955)     BTL Consent  GBS  Positive  VBAC Consent  Pap N/a due to age       DME Rx [ x] BP cuff [ ]  Weight Scale Waterbirth  [ ]  Class [ ]  Consent [ ]  CNM visit  PHQ9 & GAD7 [ x ] new OB [  ] 28 weeks  [  ] 36 weeks Induction  [ ]  Orders Entered [ ] Foley Y/N   Prenatal Transfer Tool  Maternal Diabetes: No Genetic Screening: Normal Maternal Ultrasounds/Referrals: Normal Fetal Ultrasounds or other Referrals:  None Maternal Substance Abuse:  No Significant Maternal Medications:  None Significant Maternal Lab Results: Group B Strep positive  Results for orders placed or performed during the hospital encounter of 03/20/23 (from the past 24 hour(s))  CBC   Collection Time: 03/20/23  2:27 AM  Result Value Ref Range   WBC 12.9 (H) 4.0 - 10.5 K/uL   RBC 4.34 3.87 - 5.11 MIL/uL   Hemoglobin 12.0 12.0 - 15.0 g/dL   HCT 96.0 45.4 - 09.8 %   MCV 84.6 80.0 - 100.0 fL   MCH 27.6 26.0 - 34.0 pg   MCHC 32.7 30.0 - 36.0 g/dL   RDW 11.9 14.7 - 82.9 %   Platelets 314 150 - 400 K/uL   nRBC 0.0 0.0 - 0.2 %  Type and screen   Collection Time: 03/20/23  2:27 AM  Result Value Ref Range   ABO/RH(D) O POS    Antibody Screen NEG    Sample Expiration      03/23/2023,2359 Performed at [facility], [address], [place], [place] [address]      Assessment: Tammy Stone is a 17 y.o. G1P0 with an IUP at [redacted]w[redacted]d presenting for ROM.  Got epidural soon after arrival to unit.  No appreciable change in cervix, ctx slowed down .  Discussed options, including augmentation with pitocin,  pt agreeable  Plan: #Labor: pitocin #Pain:  epidural #FWB Cat 1 #ID: GBS:  PCN  #MOF:  breast #MOC: unsure  Tammy Stone 03/20/2023, 6:12 AM

## 2023-03-20 NOTE — MAU Note (Signed)
.  Tammy Stone is a 17 y.o. at [redacted]w[redacted]d here in MAU reporting SROM at 0030. Fld is clear. Having mild ctx. 1cm Thurs in office.  Onset of complaint: 0030 Pain score: 3 Vitals:   03/20/23 0137 03/20/23 0141  BP:  (!) 134/92  Pulse: 77   Resp: 17   Temp: 98 F (36.7 C)   SpO2: 98%      FHT:130 Lab orders placed from triage:  mau labor eval

## 2023-03-20 NOTE — Anesthesia Procedure Notes (Signed)
Epidural Patient location during procedure: OB Start time: 03/20/2023 4:16 AM End time: 03/20/2023 4:22 AM  Staffing Anesthesiologist: Shelton Silvas, MD Performed: anesthesiologist   Preanesthetic Checklist Completed: patient identified, IV checked, site marked, risks and benefits discussed, surgical consent, monitors and equipment checked, pre-op evaluation and timeout performed  Epidural Patient position: sitting Prep: DuraPrep Patient monitoring: heart rate, continuous pulse ox and blood pressure Approach: midline Location: L3-L4 Injection technique: LOR saline  Needle:  Needle type: Tuohy  Needle gauge: 17 G Needle length: 9 cm Catheter type: closed end flexible Catheter size: 20 Guage Test dose: negative and 1.5% lidocaine  Assessment Events: blood not aspirated, no cerebrospinal fluid, injection not painful, no injection resistance and no paresthesia  Additional Notes LOR @ 5  Patient identified. Risks/Benefits/Options discussed with patient including but not limited to bleeding, infection, nerve damage, paralysis, failed block, incomplete pain control, headache, blood pressure changes, nausea, vomiting, reactions to medications, itching and postpartum back pain. Confirmed with bedside nurse the patient's most recent platelet count. Confirmed with patient that they are not currently taking any anticoagulation, have any bleeding history or any family history of bleeding disorders. Patient expressed understanding and wished to proceed. All questions were answered. Sterile technique was used throughout the entire procedure. Please see nursing notes for vital signs. Test dose was given through epidural catheter and negative prior to continuing to dose epidural or start infusion. Warning signs of high block given to the patient including shortness of breath, tingling/numbness in hands, complete motor block, or any concerning symptoms with instructions to call for help. Patient was  given instructions on fall risk and not to get out of bed. All questions and concerns addressed with instructions to call with any issues or inadequate analgesia.    Reason for block:procedure for pain

## 2023-03-21 LAB — CBC
HCT: 29.1 % — ABNORMAL LOW (ref 36.0–46.0)
Hemoglobin: 9.8 g/dL — ABNORMAL LOW (ref 12.0–15.0)
MCH: 28.8 pg (ref 26.0–34.0)
MCHC: 33.7 g/dL (ref 30.0–36.0)
MCV: 85.6 fL (ref 80.0–100.0)
Platelets: 260 K/uL (ref 150–400)
RBC: 3.4 MIL/uL — ABNORMAL LOW (ref 3.87–5.11)
RDW: 14.9 % (ref 11.5–15.5)
WBC: 17.7 K/uL — ABNORMAL HIGH (ref 4.0–10.5)
nRBC: 0 % (ref 0.0–0.2)

## 2023-03-21 LAB — RPR: RPR Ser Ql: NONREACTIVE — AB

## 2023-03-21 MED ORDER — LABETALOL HCL 100 MG PO TABS
100.0000 mg | ORAL_TABLET | Freq: Once | ORAL | Status: DC
  Filled 2023-03-21: qty 1

## 2023-03-21 NOTE — Progress Notes (Addendum)
POSTPARTUM PROGRESS NOTE  Subjective: Tammy Stone is a 17 y.o. G1P1001 s/p SVD at [redacted]w[redacted]d.  She reports she is doing well. She did have urinary retention overnight requiring catheterization, however, has been voiding well since then. She denies any problems with ambulating, voiding or po intake. Denies nausea or vomiting. She has passed flatus. Pain is well controlled.  Lochia is moderate. Denies any fevers, chills, lightheadedness, headache, vision changes, chest pain, or shortness of breath.    Objective: Blood pressure 118/71, pulse 81, temperature 98.1 F (36.7 C), temperature source Oral, resp. rate 17, height 5\' 5"  (1.651 m), weight 92.5 kg, last menstrual period 06/17/2022, SpO2 99%, unknown if currently breastfeeding.  Physical Exam:  General: alert, cooperative and no distress Respiratory: Clear to auscultation bilaterally, no wheezes, rales, or rhonchi. Cardio: Regular rate and rhythm, no murmurs rubs, or gallops.  Abdomen: soft, non-tender  Uterine Fundus: firm and at level of umbilicus Extremities: No calf swelling or tenderness  no edema  Recent Labs    03/20/23 0227 03/21/23 0447  HGB 12.0 9.8*  HCT 36.7 29.1*    Assessment/Plan: Tammy Stone is a 17 y.o. G1P1001 s/p SVD at 101w3d following spontaneous labor.  Routine Postpartum Care: Doing well, pain well-controlled.  -- Continue routine care, lactation support  -- Contraception: Would like to decide at 6 week postpartum visit.  -- Feeding: breast -- Anemia: continue iron supplementation.  Dispo: Plan for discharge on postpartum day #2.  Glee Arvin, MD Faculty Practice, Center for [facility] 03/21/2023 8:00 AM   Fellow Attestation  I saw and evaluated the patient, performing the key elements of the service.I  personally performed or re-performed the history, physical exam, and medical decision making activities of this service and have verified that the service and findings are  accurately documented in the resident's note. I developed the management plan that is described in the resident's note, and I agree with the content, with my edits above.    Derrel Nip, MD OB Fellow

## 2023-03-21 NOTE — Anesthesia Postprocedure Evaluation (Signed)
Anesthesia Post Note  Patient: Tammy Stone  Procedure(s) Performed: AN AD HOC LABOR EPIDURAL     Patient location during evaluation: Mother Baby Anesthesia Type: Epidural Level of consciousness: awake and alert Pain management: pain level controlled Vital Signs Assessment: post-procedure vital signs reviewed and stable Respiratory status: spontaneous breathing, nonlabored ventilation and respiratory function stable Cardiovascular status: stable Postop Assessment: no headache, no backache and epidural receding Anesthetic complications: no   No notable events documented.  Last Vitals:  Vitals:   03/21/23 0321 03/21/23 0611  BP: 129/85 118/71  Pulse: 78 81  Resp: 16 17  Temp: 36.9 C 36.7 C  SpO2: 99% 99%    Last Pain:  Vitals:   03/21/23 0611  TempSrc: Oral  PainSc: 0-No pain   Pain Goal:                   Salome Arnt

## 2023-03-21 NOTE — Progress Notes (Signed)
POSTPARTUM PROGRESS NOTE  Received call from pt's nurse regarding elevated blood pressures, most recent 141/97, trending up from 137/90 and 125/72. Will give 100mg  labetalol and continue to monitor pressures. Could consider labetalol or lasix for the next few days if BP remains elevated. Pt is asymptomatic.    Dispo: Plan for discharge PPD#2.  Para March, DO Faculty Practice, Center for [employer] 03/21/2023 10:06 PM

## 2023-03-21 NOTE — Progress Notes (Addendum)
MOB was bladder scanned after multiple failed attempts to urinate naturally. Patient was also experiencing severe buttocks pressure, "where her stitches were located". Bladder scanner estimated of urine in her bladder upon first scan and then 496 mL upon second bladder scan. Patient was straight catheterized, yielding an output of around 800 mL. Patient felt instant relief. Buttocks pressure was mostly relieved.

## 2023-03-21 NOTE — Progress Notes (Signed)
MOB was referred for history of depression/anxiety. * Referral screened out by Clinical Social Worker because none of the following criteria appear to apply: ~ History of anxiety/depression during this pregnancy, or of post-partum depression following prior delivery. ~ Diagnosis of anxiety and/or depression within last 3 years OR * MOB's symptoms currently being treated with medication and/or therapy. No concerns noted in OB record.  MOB MH is treated by [facility].   Please contact the Clinical Social Worker if needs arise, by [facility] request, or if MOB scores greater than 9/yes to question 10 on Edinburgh Postpartum Depression Screen.   MOB age (39) does not warrants a social work consult.   Blaine Hamper, MSW, LCSW Clinical Social Work [phone]

## 2023-03-22 DIAGNOSIS — O139 Gestational [pregnancy-induced] hypertension without significant proteinuria, unspecified trimester: Secondary | ICD-10-CM | POA: Insufficient documentation

## 2023-03-22 MED ORDER — BENZOCAINE-MENTHOL 20-0.5 % EX AERO: 1.0000 | INHALATION_SPRAY | CUTANEOUS | 0 refills | Status: DC | PRN

## 2023-03-22 MED ORDER — FUROSEMIDE 20 MG PO TABS: 20.0000 mg | ORAL_TABLET | Freq: Every day | ORAL | 0 refills | Status: DC

## 2023-03-22 MED ORDER — NIFEDIPINE ER 30 MG PO TB24: 30.0000 mg | ORAL_TABLET | Freq: Every day | ORAL | 0 refills | Status: AC

## 2023-03-22 MED ORDER — ACETAMINOPHEN 325 MG PO TABS: 650.0000 mg | ORAL_TABLET | ORAL | 0 refills | Status: DC | PRN

## 2023-03-22 MED ORDER — NIFEDIPINE ER OSMOTIC RELEASE 30 MG PO TB24
30.0000 mg | ORAL_TABLET | Freq: Every day | ORAL | Status: DC
  Administered 2023-03-22: 30 mg via ORAL
  Filled 2023-03-22: qty 1

## 2023-03-22 MED ORDER — IBUPROFEN 600 MG PO TABS: 600.0000 mg | ORAL_TABLET | Freq: Four times a day (QID) | ORAL | 0 refills | Status: DC

## 2023-03-22 MED ORDER — SENNOSIDES-DOCUSATE SODIUM 8.6-50 MG PO TABS: 2.0000 | ORAL_TABLET | ORAL | 0 refills | Status: AC

## 2023-03-22 MED ORDER — FUROSEMIDE 20 MG PO TABS
20.0000 mg | ORAL_TABLET | Freq: Every day | ORAL | Status: DC
  Administered 2023-03-22: 20 mg via ORAL
  Filled 2023-03-22: qty 1

## 2023-03-22 NOTE — Lactation Note (Addendum)
This note was copied from a baby's chart. Lactation Consultation Note  Patient Name: Tammy Stone [id number] Date: 03/22/2023 Age:19 hours Reason for consult: Follow-up assessment;1st time breastfeeding  P1, Request.  Weight loss 7%.  3 voids & 4 stools in the last 24 hours.  Mother hand expressed transitional breastmilk easily.  Demonstrated how to compress breast to keep baby active.  Provided mother with hand pump to prepump on L side to help baby latch.  Baby latched with ease with intermittent swallows on both breasts.    Encouraged breastfeeding on demand and offer both breasts.  Discussed pumping at home, frequency and voids/stools.  Suggest mother call for help as needed.    Maternal Data Has patient been taught Hand Expression?: Yes  Feeding Mother's Current Feeding Choice: Breast Milk  LATCH Score Latch: Grasps breast easily, tongue down, lips flanged, rhythmical sucking.  Audible Swallowing: A few with stimulation  Type of Nipple: Everted at rest and after stimulation  Comfort (Breast/Nipple): Soft / non-tender  Hold (Positioning): Assistance needed to correctly position infant at breast and maintain latch.  LATCH Score: 8   Lactation Tools Discussed/Used Tools: Pump Breast pump type: Manual  Interventions Interventions: Breast feeding basics reviewed;Assisted with latch;Skin to skin;Hand express;Pre-pump if needed;Adjust position;Hand pump;Education  Discharge Pump: Personal;DEBP;Hands Free  Consult Status Consult Status: Complete Date: 03/22/23    [name] [facility] 03/22/2023, 8:58 AM

## 2023-03-24 ENCOUNTER — Inpatient Hospital Stay (HOSPITAL_COMMUNITY): Admission: AD | Admit: 2023-03-24 | Payer: No Typology Code available for payment source | Source: Home / Self Care

## 2023-03-26 ENCOUNTER — Encounter: Payer: No Typology Code available for payment source | Admitting: Obstetrics & Gynecology

## 2023-03-26 ENCOUNTER — Ambulatory Visit (INDEPENDENT_AMBULATORY_CARE_PROVIDER_SITE_OTHER): Payer: No Typology Code available for payment source | Admitting: *Deleted

## 2023-03-26 ENCOUNTER — Other Ambulatory Visit: Payer: Self-pay | Admitting: Family Medicine

## 2023-03-26 VITALS — BP 121/84 | HR 83

## 2023-03-26 DIAGNOSIS — O165 Unspecified maternal hypertension, complicating the puerperium: Secondary | ICD-10-CM

## 2023-03-26 NOTE — Progress Notes (Signed)
Subjective:  Tammy Stone is a 17 y.o. female here for BP check.   Hypertension ROS: Patient denies any headaches, visual symptoms, RUQ/epigastric pain or other concerning symptoms.  Objective:  BP 121/84   Pulse 83   Appearance alert, well appearing, and in no distress. General exam BP noted to be well controlled today in office.    Assessment:   Blood Pressure well controlled.   Plan:  Current treatment plan is effective, no change in therapy. Follow up as needed and or at postpartum visit.   Scheryl Marten, RN

## 2023-03-31 ENCOUNTER — Inpatient Hospital Stay (HOSPITAL_COMMUNITY): Payer: No Typology Code available for payment source

## 2023-03-31 ENCOUNTER — Inpatient Hospital Stay (HOSPITAL_COMMUNITY)
Admission: RE | Admit: 2023-03-31 | Payer: No Typology Code available for payment source | Source: Home / Self Care | Admitting: Obstetrics and Gynecology

## 2023-04-07 ENCOUNTER — Ambulatory Visit (INDEPENDENT_AMBULATORY_CARE_PROVIDER_SITE_OTHER): Payer: No Typology Code available for payment source | Admitting: Internal Medicine

## 2023-04-07 ENCOUNTER — Encounter: Payer: Self-pay | Admitting: Internal Medicine

## 2023-04-07 VITALS — BP 112/68 | HR 88 | Temp 99.0°F | Resp 16 | Ht 65.0 in | Wt 178.2 lb

## 2023-04-07 DIAGNOSIS — F339 Major depressive disorder, recurrent, unspecified: Secondary | ICD-10-CM | POA: Diagnosis not present

## 2023-04-07 DIAGNOSIS — R030 Elevated blood-pressure reading, without diagnosis of hypertension: Secondary | ICD-10-CM | POA: Diagnosis not present

## 2023-04-07 DIAGNOSIS — D649 Anemia, unspecified: Secondary | ICD-10-CM

## 2023-04-07 DIAGNOSIS — Z23 Encounter for immunization: Secondary | ICD-10-CM

## 2023-04-07 NOTE — Progress Notes (Signed)
New Patient Office Visit  Subjective    Patient ID: Tammy Stone, female    DOB: [birth year]  Age: 17 y.o. MRN: [id number]  CC:  Chief Complaint  Patient presents with   Establish Care    HPI Tammy Stone presents to establish care. No history of chronic medical conditions.  She did recently give birth about 3 weeks ago to a baby girl.  She is currently on nifedipine for elevated blood pressures after delivery and was also treated with 4 days worth of Lasix.  She was anemic after delivery as well, hemoglobin 9.8.  She is on oral iron supplements.  She is following up with her OB next week to recheck labs.   MDD/Anxiety: -Mood status: stable -slightly worse the first week postpartum but improved -Current treatment: Zoloft 50 mg - tried to increase the dose in the past but was apathetic and higher doses -Satisfied with current treatment?: yes -Duration of current treatment : chronic -Side effects: no Medication compliance: excellent compliance     04/07/2023   10:54 AM 09/10/2022   10:46 AM  Depression screen PHQ 2/9  Decreased Interest 1 0  Down, Depressed, Hopeless 0 0  PHQ - 2 Score 1 0  Altered sleeping 0 2  Tired, decreased energy 0 2  Change in appetite 0 2  Feeling bad or failure about yourself  0 0  Trouble concentrating 0 0  Moving slowly or fidgety/restless 0 0  Suicidal thoughts 0 0  PHQ-9 Score 1 6  Difficult doing work/chores Not difficult at all Not difficult at all   Health maintenance: -Blood work UTD -Tdap due   Outpatient Encounter Medications as of 04/07/2023  Medication Sig   ferrous gluconate (FERGON) 324 MG tablet Take 1 tablet (324 mg total) by mouth every other day.   NIFEdipine (ADALAT CC) 30 MG 24 hr tablet Take 1 tablet (30 mg total) by mouth daily.   Prenatal Vit-Fe Fumarate-FA (PRENATAL PO) Take 1 tablet by mouth daily.   senna-docusate (SENOKOT-S) 8.6-50 MG tablet Take 2 tablets by mouth daily.   sertraline (ZOLOFT) 50 MG  tablet TAKE 1 TABLET BY MOUTH EVERY DAY   furosemide (LASIX) 20 MG tablet Take 1 tablet (20 mg total) by mouth daily for 4 days.   [DISCONTINUED] acetaminophen (TYLENOL) 325 MG tablet Take 2 tablets (650 mg total) by mouth every 4 (four) hours as needed (for pain scale < 4).   [DISCONTINUED] benzocaine-Menthol (DERMOPLAST) 20-0.5 % AERO Apply 1 Application topically as needed for irritation (perineal discomfort).   [DISCONTINUED] ibuprofen (ADVIL) 600 MG tablet Take 1 tablet (600 mg total) by mouth every 6 (six) hours.   No facility-administered encounter medications on file as of 04/07/2023.    Past Medical History:  Diagnosis Date   Anxiety    Depression    Migraine     Past Surgical History:  Procedure Laterality Date   TONSILLECTOMY     WISDOM TOOTH EXTRACTION      Family History  Problem Relation Age of Onset   Hypertension Mother    Hyperlipidemia Mother    Diabetes Mother    Thyroid cancer Father    Diabetes Maternal Grandmother    Hyperlipidemia Maternal Grandfather     Social History   Socioeconomic History   Marital status: Single    Spouse name: Not on file   Number of children: Not on file   Years of education: Not on file   Highest education level: Not on file  Occupational History    Comment: student  Tobacco Use   Smoking status: Never   Smokeless tobacco: Never  Vaping Use   Vaping status: Never Used  Substance and Sexual Activity   Alcohol use: No   Drug use: No   Sexual activity: Yes    Partners: Male  Other Topics Concern   Not on file  Social History Narrative   Not on file   Social Determinants of Health   Financial Resource Strain: Not on file  Food Insecurity: Not on file  Transportation Needs: Not on file  Physical Activity: Not on file  Stress: Not on file  Social Connections: Not on file  Intimate Partner Violence: Not on file    Review of Systems  All other systems reviewed and are negative.       Objective    BP  112/68   Pulse 88   Temp 99 F (37.2 C)   Resp 16   Ht 5\' 5"  (1.651 m)   Wt 178 lb 3.2 oz (80.8 kg)   SpO2 97%   BMI 29.65 kg/m   Physical Exam Constitutional:      Appearance: Normal appearance.  HENT:     Head: Normocephalic and atraumatic.     Mouth/Throat:     Mouth: Mucous membranes are moist.     Pharynx: Oropharynx is clear.  Eyes:     Extraocular Movements: Extraocular movements intact.     Conjunctiva/sclera: Conjunctivae normal.     Pupils: Pupils are equal, round, and reactive to light.  Neck:     Comments: No thyromegaly  Cardiovascular:     Rate and Rhythm: Normal rate and regular rhythm.  Pulmonary:     Effort: Pulmonary effort is normal.     Breath sounds: Normal breath sounds.  Musculoskeletal:     Cervical back: No tenderness.     Right lower leg: No edema.     Left lower leg: No edema.  Lymphadenopathy:     Cervical: No cervical adenopathy.  Skin:    General: Skin is warm and dry.  Neurological:     General: No focal deficit present.     Mental Status: She is alert. Mental status is at baseline.  Psychiatric:        Mood and Affect: Mood normal.        Behavior: Behavior normal.         Assessment & Plan:   1. Episode of recurrent major depressive disorder, unspecified depression episode severity (HCC): Stable, currently on Zoloft 50 mg and doing well.  Will refill medication when due.  2. Anemia, unspecified type: Currently on iron, patient following with OB next week to recheck labs.  3. Elevated BP without diagnosis of hypertension: Currently on nifedipine 30 mg, blood pressure well-controlled.  4. Need for Tdap vaccination: Tdap administered.  - Tdap vaccine greater than or equal to 7yo IM   Return in about 1 year (around 04/06/2024).   Margarita Mail, DO

## 2023-05-05 ENCOUNTER — Encounter: Payer: Self-pay | Admitting: Obstetrics and Gynecology

## 2023-05-05 ENCOUNTER — Ambulatory Visit (INDEPENDENT_AMBULATORY_CARE_PROVIDER_SITE_OTHER): Payer: No Typology Code available for payment source | Admitting: Obstetrics and Gynecology

## 2023-05-05 VITALS — BP 114/76 | HR 73 | Wt 177.0 lb

## 2023-05-05 DIAGNOSIS — K649 Unspecified hemorrhoids: Secondary | ICD-10-CM | POA: Diagnosis not present

## 2023-05-05 NOTE — Progress Notes (Signed)
    Post Partum Visit Note  Tammy Stone is a 17 y.o. G1P1001 s/p 7/19 SVD/2nd degree at 39wks after SROM. Pregnancy c/b gestational HTN.  Anesthesia: epidural. Postpartum course has been uncomplicated. Baby is doing well. Baby is feeding by breast. Bleeding  started period last week . Bowel function is normal. Bladder function is normal. Patient is not sexually active. Contraception method is none. Postpartum depression screening: pt reports just a little bit at first.  Patient stopped the procardia already.  Maybe had a period at the end of last montn   Edinburgh Postnatal Depression Scale - 05/05/23 1512       Edinburgh Postnatal Depression Scale:  In the Past 7 Days   I have been able to laugh and see the funny side of things. 0    I have looked forward with enjoyment to things. 1    I have blamed myself unnecessarily when things went wrong. 2    I have been anxious or worried for no good reason. 1    I have felt scared or panicky for no good reason. 0    Things have been getting on top of me. 1    I have been so unhappy that I have had difficulty sleeping. 0    I have felt sad or miserable. 0    I have been so unhappy that I have been crying. 0    The thought of harming myself has occurred to me. 0    Edinburgh Postnatal Depression Scale Total 5            Review of Systems A comprehensive review of systems was negative.  Objective:  BP 114/76   Pulse 73   Wt 177 lb (80.3 kg)   Breastfeeding Yes   BMI 29.45 kg/m    General: NAD Pelvic: declined  Assessment:   Normal PP visit  Plan:  *PP: routine care. Patient declines anything for contraception. Pt declines any issues with voiding or constipation or BMs. She does note hemorrhoids and sometimes notes blood after wiping. Referral to GI made *GHTN: normal BP off meds *H/o depression: no issues on zoloft  RTC PRN  Alta Bing, MD Center for [employer], [facility]

## 2023-05-20 ENCOUNTER — Encounter: Payer: Self-pay | Admitting: *Deleted

## 2023-06-30 ENCOUNTER — Ambulatory Visit: Payer: No Typology Code available for payment source | Admitting: Gastroenterology

## 2023-07-09 ENCOUNTER — Other Ambulatory Visit: Payer: Self-pay | Admitting: Internal Medicine

## 2023-07-10 NOTE — Telephone Encounter (Signed)
Requested medication (s) are due for refill today: routing for review  Requested medication (s) are on the active medication list: yes  Last refill:  03/26/23  Future visit scheduled:no  Notes to clinic:  Unable to refill per protocol, last refill by another provider.      Requested Prescriptions  Pending Prescriptions Disp Refills   sertraline (ZOLOFT) 50 MG tablet [Pharmacy Med Name: SERTRALINE HCL 50 MG TABLET] 90 tablet 1    Sig: TAKE 1 TABLET BY MOUTH EVERY DAY     There is no refill protocol information for this order

## 2024-02-23 ENCOUNTER — Ambulatory Visit
Admission: EM | Admit: 2024-02-23 | Discharge: 2024-02-23 | Disposition: A | Discharge status: Home / Self Care | Attending: Emergency Medicine | Admitting: Emergency Medicine

## 2024-02-23 ENCOUNTER — Ambulatory Visit (HOSPITAL_COMMUNITY): Payer: Self-pay

## 2024-02-23 ENCOUNTER — Encounter: Payer: Self-pay | Admitting: Emergency Medicine

## 2024-02-23 ENCOUNTER — Ambulatory Visit (INDEPENDENT_AMBULATORY_CARE_PROVIDER_SITE_OTHER)

## 2024-02-23 DIAGNOSIS — K5900 Constipation, unspecified: Secondary | ICD-10-CM

## 2024-02-23 DIAGNOSIS — R1084 Generalized abdominal pain: Secondary | ICD-10-CM

## 2024-02-23 LAB — CBC WITH DIFFERENTIAL/PLATELET
Abs Immature Granulocytes: 0.02 10*3/uL (ref 0.00–0.07)
Basophils Absolute: 0 10*3/uL (ref 0.0–0.1)
Basophils Relative: 0 %
Eosinophils Absolute: 0.1 10*3/uL (ref 0.0–0.5)
Eosinophils Relative: 1 %
HCT: 40.9 % (ref 36.0–46.0)
Hemoglobin: 13.2 g/dL (ref 12.0–15.0)
Immature Granulocytes: 0 %
Lymphocytes Relative: 28 %
Lymphs Abs: 2.4 10*3/uL (ref 0.7–4.0)
MCH: 27.1 pg (ref 26.0–34.0)
MCHC: 32.3 g/dL (ref 30.0–36.0)
MCV: 84 fL (ref 80.0–100.0)
Monocytes Absolute: 0.4 10*3/uL (ref 0.1–1.0)
Monocytes Relative: 5 %
Neutro Abs: 5.6 10*3/uL (ref 1.7–7.7)
Neutrophils Relative %: 66 %
Platelets: 269 10*3/uL (ref 150–400)
RBC: 4.87 MIL/uL (ref 3.87–5.11)
RDW: 15 % (ref 11.5–15.5)
WBC: 8.6 10*3/uL (ref 4.0–10.5)
nRBC: 0 % (ref 0.0–0.2)

## 2024-02-23 LAB — URINALYSIS, W/ REFLEX TO CULTURE (INFECTION SUSPECTED)
Bilirubin Urine: NEGATIVE
Glucose, UA: NEGATIVE mg/dL
Hgb urine dipstick: NEGATIVE
Ketones, ur: NEGATIVE mg/dL
Leukocytes,Ua: NEGATIVE
Nitrite: NEGATIVE
Protein, ur: NEGATIVE mg/dL
RBC / HPF: NONE SEEN RBC/hpf (ref 0–5)
Specific Gravity, Urine: >1.03 — ABNORMAL HIGH (ref 1.005–1.030)
WBC, UA: NONE SEEN WBC/hpf (ref 0–5)
pH: 7 (ref 5.0–8.0)

## 2024-02-23 LAB — COMPREHENSIVE METABOLIC PANEL WITH GFR
ALT: 12 U/L (ref 0–44)
AST: 19 U/L (ref 15–41)
Albumin: 4.3 g/dL (ref 3.5–5.0)
Alkaline Phosphatase: 119 U/L (ref 38–126)
Anion gap: 9 (ref 5–15)
BUN: 13 mg/dL (ref 6–20)
CO2: 24 mmol/L (ref 22–32)
Calcium: 9.7 mg/dL (ref 8.9–10.3)
Chloride: 104 mmol/L (ref 98–111)
Creatinine, Ser: 0.6 mg/dL (ref 0.44–1.00)
GFR, Estimated: >60 mL/min (ref >60)
Glucose, Bld: 119 mg/dL — ABNORMAL HIGH (ref 70–99)
Potassium: 4.3 mmol/L (ref 3.5–5.1)
Sodium: 137 mmol/L (ref 135–145)
Total Bilirubin: 0.4 mg/dL (ref 0.0–1.2)
Total Protein: 8.3 g/dL — ABNORMAL HIGH (ref 6.5–8.1)

## 2024-02-23 LAB — LIPASE, BLOOD: Lipase: 29 U/L (ref 11–51)

## 2024-02-23 LAB — PREGNANCY, URINE: Preg Test, Ur: NEGATIVE

## 2024-02-23 MED ORDER — ONDANSETRON 4 MG PO TBDP: 4.0000 mg | ORAL_TABLET | Freq: Once | ORAL | Status: DC

## 2024-02-23 NOTE — Discharge Instructions (Addendum)
 Increase your oral water intake so that you are remaining well-hydrated and there is increased water to help soften your bowel.  Take over-the-counter MiraLAX, 1 capful (17 g) in 8 ounces of a beverage of your choice daily until you are having normal bowel movements.  You can also take 2 over-the-counter Dulcolax tablets along with your first dose of MiraLAX to help move your stools along.  Increase your oral dietary fiber intake so as to add bulk to your stool and help decrease incidence of constipation.  If you have any increasing abdominal pain, blood in your stool, abdominal bloating, or fever please follow-up in the emergency department for evaluation.

## 2024-02-23 NOTE — ED Provider Notes (Addendum)
 [facility]    CSN: [id number] Arrival date & time: 02/23/24  1553      History   Chief Complaint Chief Complaint  Patient presents with   Abdominal Pain    HPI Tammy Stone is a 18 y.o. female.   HPI  18 year old female with past medical history significant for anxiety, depression, migraine headaches presents for evaluation of acute onset of abdominal pain that she describes as being sharp in nature and in her lower abdomen.  Symptoms started 90 minutes ago.  The pain is better at rest and worse with ambulation or movement.  It is associated with nausea but no vomiting.  She also denies fever, painful urination, urinary urgency or frequency, hematuria, or diarrhea.  No complaints of vaginal discharge or itching.  The pain is rated as a 6-7/10 and sharp.  Patient's last BM was around the time her abdominal pain started but it did not alleviate her symptoms.  Last menstrual cycle was 3 weeks ago.  Past Medical History:  Diagnosis Date   Anxiety    Depression    Migraine     There are no active problems to display for this patient.   Past Surgical History:  Procedure Laterality Date   TONSILLECTOMY     WISDOM TOOTH EXTRACTION      OB History     Gravida  1   Para  1   Term  1   Preterm      AB      Living  1      SAB      IAB      Ectopic      Multiple  0   Live Births  1            Home Medications    Prior to Admission medications   Medication Sig Start Date End Date Taking? Authorizing Provider  sertraline  (ZOLOFT ) 50 MG tablet TAKE 1 TABLET BY MOUTH EVERY DAY 07/10/23  Yes Bernardo Fend, DO  ferrous gluconate  (FERGON) 324 MG tablet Take 1 tablet (324 mg total) by mouth every other day. 01/02/23   Weinhold, Samantha C, CNM  NIFEdipine  (ADALAT  CC) 30 MG 24 hr tablet Take 1 tablet (30 mg total) by mouth daily. Patient not taking: Reported on 05/05/2023 03/22/23   Ndulue, Chiagoziem J, MD  Prenatal Vit-Fe Fumarate-FA  (PRENATAL PO) Take 1 tablet by mouth daily.    [provider]  senna-docusate (SENOKOT-S) 8.6-50 MG tablet Take 2 tablets by mouth daily. 03/22/23   Mercado-Ortiz, Harlene RODES, DO    Family History Family History  Problem Relation Age of Onset   Hypertension Mother    Hyperlipidemia Mother    Diabetes Mother    Thyroid cancer Father    Diabetes Maternal Grandmother    Hyperlipidemia Maternal Grandfather     Social History Social History   Tobacco Use   Smoking status: Never   Smokeless tobacco: Never  Vaping Use   Vaping status: Never Used  Substance Use Topics   Alcohol use: No   Drug use: No     Allergies   Patient has no known allergies.   Review of Systems Review of Systems  Constitutional:  Negative for fever.  Gastrointestinal:  Positive for abdominal pain and nausea. Negative for blood in stool, diarrhea and vomiting.  Genitourinary:  Negative for dysuria, frequency, hematuria, urgency, vaginal discharge and vaginal pain.  Skin:  Negative for rash.     Physical Exam Triage  Vital Signs ED Triage Vitals  Encounter Vitals Group     BP      Girls Systolic BP Percentile      Girls Diastolic BP Percentile      Boys Systolic BP Percentile      Boys Diastolic BP Percentile      Pulse      Resp      Temp      Temp src      SpO2      Weight      Height      Head Circumference      Peak Flow      Pain Score      Pain Loc      Pain Education      Exclude from Growth Chart    No data found.  Updated Vital Signs BP 127/84 (BP Location: Right Arm)   Pulse 90   Temp 99 F (37.2 C) (Oral)   Resp 17   LMP 01/25/2024   SpO2 98%   Breastfeeding Yes   Visual Acuity Right Eye Distance:   Left Eye Distance:   Bilateral Distance:    Right Eye Near:   Left Eye Near:    Bilateral Near:     Physical Exam Vitals and nursing note reviewed.  Constitutional:      Appearance: Normal appearance. She is not ill-appearing.  HENT:     Head:  Normocephalic and atraumatic.  Abdominal:     General: Abdomen is flat.     Palpations: Abdomen is soft.     Tenderness: There is abdominal tenderness. There is no guarding or rebound.   Skin:    General: Skin is warm and dry.     Capillary Refill: Capillary refill takes less than 2 seconds.     Findings: No rash.   Neurological:     General: No focal deficit present.     Mental Status: She is alert and oriented to person, place, and time.      UC Treatments / Results  Labs (all labs ordered are listed, but only abnormal results are displayed) Labs Reviewed  URINALYSIS, W/ REFLEX TO CULTURE (INFECTION SUSPECTED) - Abnormal; Notable for the following components:      Result Value   Specific Gravity, Urine >1.030 (*)    Bacteria, UA RARE (*)    All other components within normal limits  COMPREHENSIVE METABOLIC PANEL WITH GFR - Abnormal; Notable for the following components:   Glucose, Bld 119 (*)    Total Protein 8.3 (*)    All other components within normal limits  CBC WITH DIFFERENTIAL/PLATELET  LIPASE, BLOOD  PREGNANCY, URINE    EKG   Radiology No results found.  Procedures Procedures (including critical care time)  Medications Ordered in UC Medications  ondansetron  (ZOFRAN -ODT) disintegrating tablet 4 mg (4 mg Oral Patient Refused/Not Given 02/23/24 1633)    Initial Impression / Assessment and Plan / UC Course  I have reviewed the triage vital signs and the nursing notes.  Pertinent labs & imaging results that were available during my care of the patient were reviewed by me and considered in my medical decision making (see chart for details).   Patient is a pleasant, nontoxic-appearing 17 year old female presenting for evaluation of sharp lower abdominal pain.  On exam patient's abdomen is soft and flat though she has tenderness in the left upper quadrant, left lower quadrant, suprapubic region, and right lower quadrant.  Right upper quadrant and epigastric are  unremarkable.  She denies any associated genitourinary symptoms and reports her last bowel movement was around the time her pain started.  Having a bowel movement did not improve her pain.  Patient's mother is concerned about her female parts.  Last menstrual cycle was Memorial Day weekend.  Differential diagnose include pancreatitis, colitis, urinary tract infection, ovarian cyst, appendicitis, small bowel obstruction, UTI, constipation.  I will order CBC to assess for the presence of any systemic infection, CMP to evaluate liver function and electrolytes, urinalysis to assess for the presence of UTI, urine pregnancy test to rule out urine pregnancy, lipase to evaluate for possible pancreatitis, and two-view abdomen to assess for any acute intra-abdominal pathology.  I will also order 4 mg of ODT Zofran  as patient reports that she is becoming nauseous due to the pain.  Urinalysis shows a high specific gravity but is negative for leukocyte esterase, nitrates, protein, ketones, hemoglobin, or glucose.  Reflex microscopy shows rare bacteria.  Urine pregnancy test is negative.  Patient is declining Zofran  at this time.  CBC is completely unremarkable.  CMP shows normal electrolytes, renal function, and transaminases.  Mild elevation of total protein at 8.3 and mild elevation glucose at 119.  Lipase is normal at 29.  2 view abdomen independently reviewed and evaluated by me.  Impression: Nonspecific air-fluid levels.  Significant stool burden throughout the colon. Radiology impression states negative exam.  I will discharge patient home with a diagnosis of constipation and have her use over-the-counter MiraLAX daily to help resolve her constipation.  She may also use over-the-counter Dulcolax, 2 tablets with her first dose of MiraLAX to help resolve her constipation  Final Clinical Impressions(s) / UC Diagnoses   Final diagnoses:  Generalized abdominal pain  Constipation, unspecified  constipation type     Discharge Instructions      Increase your oral water intake so that you are remaining well-hydrated and there is increased water to help soften your bowel.  Take over-the-counter MiraLAX, 1 capful (17 g) in 8 ounces of a beverage of your choice daily until you are having normal bowel movements.  You can also take 2 over-the-counter Dulcolax tablets along with your first dose of MiraLAX to help move your stools along.  Increase your oral dietary fiber intake so as to add bulk to your stool and help decrease incidence of constipation.  If you have any increasing abdominal pain, blood in your stool, abdominal bloating, or fever please follow-up in the emergency department for evaluation.      ED Prescriptions   None    PDMP not reviewed this encounter.   Bernardino Ditch, NP 02/23/24 1707    Bernardino Ditch, NP 02/23/24 [phone]

## 2024-02-23 NOTE — ED Triage Notes (Signed)
 Sx started about an hr ago. Sharp lower abdominal pain. Patient  had BM that hurt but didn't relieve pain

## 2024-03-26 ENCOUNTER — Other Ambulatory Visit: Payer: Self-pay | Admitting: Internal Medicine

## 2024-03-28 NOTE — Telephone Encounter (Signed)
 Requested Prescriptions  Pending Prescriptions Disp Refills   sertraline  (ZOLOFT ) 50 MG tablet [Pharmacy Med Name: SERTRALINE  HCL 50 MG TABLET] 15 tablet 0    Sig: TAKE 1 TABLET BY MOUTH EVERY DAY     Psychiatry:  Antidepressants - SSRI - sertraline  Failed - 03/28/2024  2:56 PM      Failed - Valid encounter within last 6 months    Recent Outpatient Visits   None     Future Appointments             In 1 week Bernardo Fend, DO [facility]            Passed - AST in normal range and within 360 days    AST  Date Value Ref Range Status  02/23/2024 19 15 - 41 U/L Final   SGOT(AST)  Date Value Ref Range Status  08/29/2013 50 (H) 5 - 36 Unit/L Final         Passed - ALT in normal range and within 360 days    ALT  Date Value Ref Range Status  02/23/2024 12 0 - 44 U/L Final   SGPT (ALT)  Date Value Ref Range Status  08/29/2013 46 12 - 78 U/L Final         Passed - Completed PHQ-2 or PHQ-9 in the last 360 days

## 2024-04-08 ENCOUNTER — Ambulatory Visit: Payer: No Typology Code available for payment source | Admitting: Internal Medicine
